# Patient Record
Sex: Male | Born: 1960
Health system: Southern US, Community
[De-identification: ages and names within clinical notes are randomized; demographics above are authoritative.]

## PROBLEM LIST (undated history)

## (undated) DIAGNOSIS — E785 Hyperlipidemia, unspecified: Secondary | ICD-10-CM

## (undated) DIAGNOSIS — K219 Gastro-esophageal reflux disease without esophagitis: Secondary | ICD-10-CM

## (undated) DIAGNOSIS — Z720 Tobacco use: Secondary | ICD-10-CM

## (undated) DIAGNOSIS — N529 Male erectile dysfunction, unspecified: Secondary | ICD-10-CM

## (undated) DIAGNOSIS — E079 Disorder of thyroid, unspecified: Secondary | ICD-10-CM

## (undated) DIAGNOSIS — E039 Hypothyroidism, unspecified: Secondary | ICD-10-CM

## (undated) DIAGNOSIS — F419 Anxiety disorder, unspecified: Secondary | ICD-10-CM

## (undated) HISTORY — DX: Tobacco use: Z72.0

## (undated) HISTORY — DX: Hyperlipidemia, unspecified: E78.5

## (undated) HISTORY — PX: UMBILICAL HERNIA REPAIR: SUR1181

## (undated) HISTORY — DX: Male erectile dysfunction, unspecified: N52.9

## (undated) HISTORY — DX: Disorder of thyroid, unspecified: E07.9

## (undated) HISTORY — DX: Gastro-esophageal reflux disease without esophagitis: K21.9

## (undated) HISTORY — PX: HERNIA REPAIR: SHX51

## (undated) HISTORY — PX: POLYPECTOMY: SHX149

## (undated) HISTORY — PX: COLONOSCOPY: SHX174

## (undated) HISTORY — PX: SPINE SURGERY: SHX786

## (undated) HISTORY — DX: Anxiety disorder, unspecified: F41.9

## (undated) HISTORY — PX: WISDOM TOOTH EXTRACTION: SHX21

---

## 1996-11-15 HISTORY — PX: APPENDECTOMY: SHX54

## 1998-11-15 HISTORY — PX: VASECTOMY: SHX75

## 2001-11-15 HISTORY — PX: HERNIA REPAIR: SHX51

## 2002-11-27 ENCOUNTER — Ambulatory Visit (HOSPITAL_COMMUNITY): Admission: RE | Admit: 2002-11-27 | Discharge: 2002-11-27 | Payer: Self-pay | Admitting: *Deleted

## 2005-09-03 ENCOUNTER — Ambulatory Visit: Payer: Self-pay | Admitting: Family Medicine

## 2005-09-13 ENCOUNTER — Ambulatory Visit: Payer: Self-pay | Admitting: Family Medicine

## 2006-09-19 ENCOUNTER — Ambulatory Visit: Payer: Self-pay | Admitting: Family Medicine

## 2006-09-19 LAB — CONVERTED CEMR LAB
ALT: 22 units/L (ref 0–40)
AST: 22 units/L (ref 0–37)
Albumin: 4.2 g/dL (ref 3.5–5.2)
Alkaline Phosphatase: 40 units/L (ref 39–117)
BUN: 16 mg/dL (ref 6–23)
Basophils Absolute: 0 10*3/uL (ref 0.0–0.1)
Basophils Relative: 0 % (ref 0.0–1.0)
CO2: 30 meq/L (ref 19–32)
Calcium: 9.4 mg/dL (ref 8.4–10.5)
Chloride: 105 meq/L (ref 96–112)
Chol/HDL Ratio, serum: 5.3
Cholesterol: 182 mg/dL (ref 0–200)
Creatinine, Ser: 1.1 mg/dL (ref 0.4–1.5)
Eosinophil percent: 2.4 % (ref 0.0–5.0)
GFR calc non Af Amer: 77 mL/min
Glomerular Filtration Rate, Af Am: 94 mL/min/{1.73_m2}
Glucose, Bld: 92 mg/dL (ref 70–99)
HCT: 43.6 % (ref 39.0–52.0)
HDL: 34.4 mg/dL — ABNORMAL LOW (ref >39.0)
Hemoglobin: 14.7 g/dL (ref 13.0–17.0)
LDL Cholesterol: 129 mg/dL — ABNORMAL HIGH (ref 0–99)
Lymphocytes Relative: 48 % — ABNORMAL HIGH (ref 12.0–46.0)
MCHC: 33.7 g/dL (ref 30.0–36.0)
MCV: 91.9 fL (ref 78.0–100.0)
Monocytes Absolute: 0.4 10*3/uL (ref 0.2–0.7)
Monocytes Relative: 8.5 % (ref 3.0–11.0)
Neutro Abs: 1.9 10*3/uL (ref 1.4–7.7)
Neutrophils Relative %: 41.1 % — ABNORMAL LOW (ref 43.0–77.0)
Platelets: 176 10*3/uL (ref 150–400)
Potassium: 4.5 meq/L (ref 3.5–5.1)
RBC: 4.75 M/uL (ref 4.22–5.81)
RDW: 12.5 % (ref 11.5–14.6)
Sodium: 140 meq/L (ref 135–145)
TSH: 5.87 microintl units/mL — ABNORMAL HIGH (ref 0.35–5.50)
Total Bilirubin: 0.8 mg/dL (ref 0.3–1.2)
Total Protein: 6.8 g/dL (ref 6.0–8.3)
Triglyceride fasting, serum: 94 mg/dL (ref 0–149)
VLDL: 19 mg/dL (ref 0–40)
WBC: 4.7 10*3/uL (ref 4.5–10.5)

## 2006-09-26 ENCOUNTER — Ambulatory Visit: Payer: Self-pay | Admitting: Family Medicine

## 2007-09-20 ENCOUNTER — Ambulatory Visit: Payer: Self-pay | Admitting: Family Medicine

## 2007-09-20 LAB — CONVERTED CEMR LAB
ALT: 37 units/L (ref 0–53)
AST: 37 units/L (ref 0–37)
Albumin: 4.4 g/dL (ref 3.5–5.2)
Alkaline Phosphatase: 49 units/L (ref 39–117)
BUN: 15 mg/dL (ref 6–23)
Basophils Absolute: 0 10*3/uL (ref 0.0–0.1)
Basophils Relative: 0.9 % (ref 0.0–1.0)
Bilirubin Urine: NEGATIVE
Bilirubin, Direct: 0.2 mg/dL (ref 0.0–0.3)
Blood in Urine, dipstick: NEGATIVE
CO2: 32 meq/L (ref 19–32)
Calcium: 10.2 mg/dL (ref 8.4–10.5)
Chloride: 106 meq/L (ref 96–112)
Cholesterol: 163 mg/dL (ref 0–200)
Creatinine, Ser: 1.2 mg/dL (ref 0.4–1.5)
Eosinophils Absolute: 0.1 10*3/uL (ref 0.0–0.6)
Eosinophils Relative: 2.4 % (ref 0.0–5.0)
GFR calc Af Amer: 84 mL/min
GFR calc non Af Amer: 70 mL/min
Glucose, Bld: 101 mg/dL — ABNORMAL HIGH (ref 70–99)
Glucose, Urine, Semiquant: NEGATIVE
HCT: 43.5 % (ref 39.0–52.0)
HDL: 29.8 mg/dL — ABNORMAL LOW (ref >39.0)
Hemoglobin: 15.2 g/dL (ref 13.0–17.0)
Ketones, urine, test strip: NEGATIVE
LDL Cholesterol: 112 mg/dL — ABNORMAL HIGH (ref 0–99)
Lymphocytes Relative: 46.8 % — ABNORMAL HIGH (ref 12.0–46.0)
MCHC: 35 g/dL (ref 30.0–36.0)
MCV: 90.2 fL (ref 78.0–100.0)
Monocytes Absolute: 0.4 10*3/uL (ref 0.2–0.7)
Monocytes Relative: 9 % (ref 3.0–11.0)
Neutro Abs: 1.7 10*3/uL (ref 1.4–7.7)
Neutrophils Relative %: 40.9 % — ABNORMAL LOW (ref 43.0–77.0)
Nitrite: NEGATIVE
Platelets: 185 10*3/uL (ref 150–400)
Potassium: 5.4 meq/L — ABNORMAL HIGH (ref 3.5–5.1)
Protein, U semiquant: NEGATIVE
RBC: 4.82 M/uL (ref 4.22–5.81)
RDW: 12.3 % (ref 11.5–14.6)
Sodium: 148 meq/L — ABNORMAL HIGH (ref 135–145)
Specific Gravity, Urine: 1.02
TSH: 7.18 microintl units/mL — ABNORMAL HIGH (ref 0.35–5.50)
Total Bilirubin: 1.1 mg/dL (ref 0.3–1.2)
Total CHOL/HDL Ratio: 5.5
Total Protein: 6.9 g/dL (ref 6.0–8.3)
Triglycerides: 108 mg/dL (ref 0–149)
Urobilinogen, UA: NEGATIVE
VLDL: 22 mg/dL (ref 0–40)
WBC Urine, dipstick: NEGATIVE
WBC: 4.2 10*3/uL — ABNORMAL LOW (ref 4.5–10.5)
pH: 7

## 2007-10-02 ENCOUNTER — Ambulatory Visit: Payer: Self-pay | Admitting: Family Medicine

## 2007-10-02 DIAGNOSIS — E785 Hyperlipidemia, unspecified: Secondary | ICD-10-CM | POA: Insufficient documentation

## 2007-10-02 DIAGNOSIS — F411 Generalized anxiety disorder: Secondary | ICD-10-CM | POA: Insufficient documentation

## 2007-10-02 DIAGNOSIS — E039 Hypothyroidism, unspecified: Secondary | ICD-10-CM | POA: Insufficient documentation

## 2007-10-16 ENCOUNTER — Telehealth: Payer: Self-pay | Admitting: Family Medicine

## 2007-10-23 ENCOUNTER — Ambulatory Visit: Payer: Self-pay | Admitting: Family Medicine

## 2007-10-23 LAB — CONVERTED CEMR LAB: TSH: 6.57 microintl units/mL — ABNORMAL HIGH (ref 0.35–5.50)

## 2007-10-25 ENCOUNTER — Telehealth: Payer: Self-pay | Admitting: Family Medicine

## 2008-01-09 ENCOUNTER — Ambulatory Visit: Payer: Self-pay | Admitting: Family Medicine

## 2008-01-09 LAB — CONVERTED CEMR LAB: TSH: 3.18 microintl units/mL (ref 0.35–5.50)

## 2008-03-26 ENCOUNTER — Telehealth (INDEPENDENT_AMBULATORY_CARE_PROVIDER_SITE_OTHER): Payer: Self-pay | Admitting: *Deleted

## 2008-04-03 ENCOUNTER — Telehealth: Payer: Self-pay | Admitting: Family Medicine

## 2008-09-18 ENCOUNTER — Ambulatory Visit: Payer: Self-pay | Admitting: Family Medicine

## 2008-09-18 LAB — CONVERTED CEMR LAB
ALT: 21 units/L (ref 0–53)
AST: 25 units/L (ref 0–37)
Albumin: 4.3 g/dL (ref 3.5–5.2)
Alkaline Phosphatase: 52 units/L (ref 39–117)
BUN: 18 mg/dL (ref 6–23)
Basophils Absolute: 0 10*3/uL (ref 0.0–0.1)
Basophils Relative: 0 % (ref 0.0–3.0)
Bilirubin Urine: NEGATIVE
Bilirubin, Direct: 0.1 mg/dL (ref 0.0–0.3)
Blood in Urine, dipstick: NEGATIVE
CO2: 28 meq/L (ref 19–32)
Calcium: 9.5 mg/dL (ref 8.4–10.5)
Chloride: 107 meq/L (ref 96–112)
Cholesterol: 139 mg/dL (ref 0–200)
Creatinine, Ser: 1 mg/dL (ref 0.4–1.5)
Eosinophils Absolute: 0.1 10*3/uL (ref 0.0–0.7)
Eosinophils Relative: 1.9 % (ref 0.0–5.0)
GFR calc Af Amer: 103 mL/min
GFR calc non Af Amer: 86 mL/min
Glucose, Bld: 89 mg/dL (ref 70–99)
Glucose, Urine, Semiquant: NEGATIVE
HCT: 43.3 % (ref 39.0–52.0)
HDL: 33.4 mg/dL — ABNORMAL LOW (ref >39.0)
Hemoglobin: 15 g/dL (ref 13.0–17.0)
Ketones, urine, test strip: NEGATIVE
LDL Cholesterol: 89 mg/dL (ref 0–99)
Lymphocytes Relative: 51.6 % — ABNORMAL HIGH (ref 12.0–46.0)
MCHC: 34.5 g/dL (ref 30.0–36.0)
MCV: 90.4 fL (ref 78.0–100.0)
Monocytes Absolute: 0.4 10*3/uL (ref 0.1–1.0)
Monocytes Relative: 9.2 % (ref 3.0–12.0)
Neutro Abs: 1.7 10*3/uL (ref 1.4–7.7)
Neutrophils Relative %: 37.3 % — ABNORMAL LOW (ref 43.0–77.0)
Nitrite: NEGATIVE
Platelets: 178 10*3/uL (ref 150–400)
Potassium: 4.8 meq/L (ref 3.5–5.1)
Protein, U semiquant: NEGATIVE
RBC: 4.8 M/uL (ref 4.22–5.81)
RDW: 12.8 % (ref 11.5–14.6)
Sodium: 143 meq/L (ref 135–145)
Specific Gravity, Urine: 1.02
TSH: 6.4 microintl units/mL — ABNORMAL HIGH (ref 0.35–5.50)
Total Bilirubin: 0.9 mg/dL (ref 0.3–1.2)
Total CHOL/HDL Ratio: 4.2
Total Protein: 6.9 g/dL (ref 6.0–8.3)
Triglycerides: 85 mg/dL (ref 0–149)
Urobilinogen, UA: 0.2
VLDL: 17 mg/dL (ref 0–40)
WBC Urine, dipstick: NEGATIVE
WBC: 4.5 10*3/uL (ref 4.5–10.5)
pH: 7

## 2008-10-03 ENCOUNTER — Ambulatory Visit: Payer: Self-pay | Admitting: Family Medicine

## 2008-10-03 DIAGNOSIS — F528 Other sexual dysfunction not due to a substance or known physiological condition: Secondary | ICD-10-CM | POA: Insufficient documentation

## 2008-10-03 DIAGNOSIS — F17200 Nicotine dependence, unspecified, uncomplicated: Secondary | ICD-10-CM | POA: Insufficient documentation

## 2008-10-22 ENCOUNTER — Telehealth: Payer: Self-pay | Admitting: *Deleted

## 2009-04-29 ENCOUNTER — Ambulatory Visit: Payer: Self-pay | Admitting: Family Medicine

## 2009-04-29 DIAGNOSIS — L909 Atrophic disorder of skin, unspecified: Secondary | ICD-10-CM | POA: Insufficient documentation

## 2009-04-29 DIAGNOSIS — L919 Hypertrophic disorder of the skin, unspecified: Secondary | ICD-10-CM

## 2009-09-24 ENCOUNTER — Ambulatory Visit: Payer: Self-pay | Admitting: Family Medicine

## 2009-09-24 LAB — CONVERTED CEMR LAB
ALT: 35 units/L (ref 0–53)
AST: 33 units/L (ref 0–37)
Albumin: 4.6 g/dL (ref 3.5–5.2)
Alkaline Phosphatase: 49 units/L (ref 39–117)
BUN: 10 mg/dL (ref 6–23)
Basophils Absolute: 0 10*3/uL (ref 0.0–0.1)
Basophils Relative: 0.8 % (ref 0.0–3.0)
Bilirubin Urine: NEGATIVE
Bilirubin, Direct: 0.1 mg/dL (ref 0.0–0.3)
Blood in Urine, dipstick: NEGATIVE
CO2: 29 meq/L (ref 19–32)
Calcium: 9.6 mg/dL (ref 8.4–10.5)
Chloride: 106 meq/L (ref 96–112)
Cholesterol: 164 mg/dL (ref 0–200)
Creatinine, Ser: 1.1 mg/dL (ref 0.4–1.5)
Eosinophils Absolute: 0.1 10*3/uL (ref 0.0–0.7)
Eosinophils Relative: 2.2 % (ref 0.0–5.0)
GFR calc non Af Amer: 76.18 mL/min (ref >60)
Glucose, Bld: 106 mg/dL — ABNORMAL HIGH (ref 70–99)
Glucose, Urine, Semiquant: NEGATIVE
HCT: 44.1 % (ref 39.0–52.0)
HDL: 34 mg/dL — ABNORMAL LOW (ref >39.00)
Hemoglobin: 15.1 g/dL (ref 13.0–17.0)
Ketones, urine, test strip: NEGATIVE
LDL Cholesterol: 106 mg/dL — ABNORMAL HIGH (ref 0–99)
Lymphocytes Relative: 47.1 % — ABNORMAL HIGH (ref 12.0–46.0)
Lymphs Abs: 2.2 10*3/uL (ref 0.7–4.0)
MCHC: 34.3 g/dL (ref 30.0–36.0)
MCV: 92.5 fL (ref 78.0–100.0)
Monocytes Absolute: 0.4 10*3/uL (ref 0.1–1.0)
Monocytes Relative: 9.2 % (ref 3.0–12.0)
Neutro Abs: 1.8 10*3/uL (ref 1.4–7.7)
Neutrophils Relative %: 40.7 % — ABNORMAL LOW (ref 43.0–77.0)
Nitrite: NEGATIVE
Platelets: 161 10*3/uL (ref 150.0–400.0)
Potassium: 4.9 meq/L (ref 3.5–5.1)
Protein, U semiquant: NEGATIVE
RBC: 4.76 M/uL (ref 4.22–5.81)
RDW: 12.8 % (ref 11.5–14.6)
Sodium: 144 meq/L (ref 135–145)
Specific Gravity, Urine: 1.01
TSH: 3.89 microintl units/mL (ref 0.35–5.50)
Total Bilirubin: 1.2 mg/dL (ref 0.3–1.2)
Total CHOL/HDL Ratio: 5
Total Protein: 7.5 g/dL (ref 6.0–8.3)
Triglycerides: 118 mg/dL (ref 0.0–149.0)
Urobilinogen, UA: 0.2
VLDL: 23.6 mg/dL (ref 0.0–40.0)
WBC Urine, dipstick: NEGATIVE
WBC: 4.5 10*3/uL (ref 4.5–10.5)
pH: 7

## 2009-10-06 ENCOUNTER — Ambulatory Visit: Payer: Self-pay | Admitting: Family Medicine

## 2010-07-07 ENCOUNTER — Ambulatory Visit: Payer: Self-pay | Admitting: Family Medicine

## 2010-07-07 DIAGNOSIS — L255 Unspecified contact dermatitis due to plants, except food: Secondary | ICD-10-CM | POA: Insufficient documentation

## 2010-10-15 ENCOUNTER — Ambulatory Visit: Payer: Self-pay | Admitting: Family Medicine

## 2010-10-15 LAB — CONVERTED CEMR LAB
ALT: 31 units/L (ref 0–53)
Albumin: 4.4 g/dL (ref 3.5–5.2)
BUN: 15 mg/dL (ref 6–23)
Basophils Absolute: 0 10*3/uL (ref 0.0–0.1)
Bilirubin Urine: NEGATIVE
Blood in Urine, dipstick: NEGATIVE
CO2: 29 meq/L (ref 19–32)
Eosinophils Relative: 1.9 % (ref 0.0–5.0)
Glucose, Urine, Semiquant: NEGATIVE
HCT: 42.5 % (ref 39.0–52.0)
HDL: 35.1 mg/dL — ABNORMAL LOW (ref >39.00)
Ketones, urine, test strip: NEGATIVE
LDL Cholesterol: 114 mg/dL — ABNORMAL HIGH (ref 0–99)
Lymphs Abs: 2.3 10*3/uL (ref 0.7–4.0)
MCV: 90.1 fL (ref 78.0–100.0)
Monocytes Absolute: 0.4 10*3/uL (ref 0.1–1.0)
Nitrite: NEGATIVE
Platelets: 186 10*3/uL (ref 150.0–400.0)
Protein, U semiquant: NEGATIVE
RDW: 13.6 % (ref 11.5–14.6)
Sodium: 136 meq/L (ref 135–145)
Specific Gravity, Urine: 1.02
TSH: 7.17 microintl units/mL — ABNORMAL HIGH (ref 0.35–5.50)
Total Bilirubin: 0.9 mg/dL (ref 0.3–1.2)
Total CHOL/HDL Ratio: 5
Triglycerides: 129 mg/dL (ref 0.0–149.0)
Urobilinogen, UA: 0.2
VLDL: 25.8 mg/dL (ref 0.0–40.0)
WBC Urine, dipstick: NEGATIVE
pH: 7

## 2010-10-22 ENCOUNTER — Ambulatory Visit: Payer: Self-pay | Admitting: Family Medicine

## 2010-10-22 ENCOUNTER — Encounter: Payer: Self-pay | Admitting: Family Medicine

## 2010-10-22 DIAGNOSIS — K625 Hemorrhage of anus and rectum: Secondary | ICD-10-CM | POA: Insufficient documentation

## 2010-11-27 ENCOUNTER — Encounter (INDEPENDENT_AMBULATORY_CARE_PROVIDER_SITE_OTHER): Payer: Self-pay | Admitting: *Deleted

## 2010-12-15 NOTE — Assessment & Plan Note (Signed)
Summary: cpx//ccm   Vital Signs:  Patient profile:   50 year old male Height:      76.25 inches Weight:      272 pounds Temp:     97.6 degrees F oral Pulse rate:   52 / minute BP sitting:   100 / 68  (left arm) Cuff size:   regular  Vitals Entered By: Kathlene November LPN (October 22, 2010 10:34 AM) CC: CPE   CC:  CPE.  History of Present Illness: [name] is a 50 year old male, married, nonsmoker, who comes in today for general physical examination because of history of hyperlipidemia, hypothyroidism, eczema, erectile dysfunction, and a new problem of bright red rectal bleeding.  He takes Zocor 40 mg nightly for hyperlipidemia.  Lipids are goal.  He also takes 5 or 50 mg p.r.n. for ED.  He also takes Synthroid 75 micrograms daily because of a history of hypothyroidism.  He also uses triamcinolone ointment p.r.n. for eczema.  Review of systems negative except he is having some bright red rectal bleeding.  He states this is been going off and on for a couple years.  No pain however, his mother has a history of colon polyps.  He gets routine eye care, dental care, tetanus, 2005, declines a flu shot  Current Medications (verified): 1)  Zocor 40 Mg  Tabs (Simvastatin) .Marland Kitchen.. 1 Tab @ Bedtime 2)  Viagra 50 Mg  Tabs (Sildenafil Citrate) .... Uad 3)  Synthroid 75 Mcg Tabs (Levothyroxine Sodium) .... Take 1 Tablet By Mouth Every Morning 4)  Triamcinolone Acetonide 0.1 % Oint (Triamcinolone Acetonide) .... Apply Two Times A Day  Allergies (verified): No Known Drug Allergies  Comments:  Nurse/Medical Assistant: The patient's medications and allergies were reviewed with the patient and were updated in the Medication and Allergy Lists. Kathlene November LPN (October 22, 2010 10:35 AM)  Past History:  Past medical, surgical, family and social histories (including risk factors) reviewed, and no changes noted (except as noted below).  Past Medical History: Reviewed history from 10/03/2008 and  no changes required. Anxiety secondary to divorce Hyperlipidemia ED Hypothyroidism tobacco abuse  Past Surgical History: Reviewed history from 10/03/2008 and no changes required. inguinal hernia repair with redo at age 65  Family History: Reviewed history from 10/02/2007 and no changes required. Family History High cholesterol Family History Hypertension  Social History: Reviewed history from 10/06/2009 and no changes required. Occupation: Divorced Alcohol use-no Drug use-no Regular exercise-yes Former Smoker   05/2009 quit  Review of Systems      See HPI  Physical Exam  General:  Well-developed,well-nourished,in no acute distress; alert,appropriate and cooperative throughout examination Head:  Normocephalic and atraumatic without obvious abnormalities. No apparent alopecia or balding. Eyes:  No corneal or conjunctival inflammation noted. EOMI. Perrla. Funduscopic exam benign, without hemorrhages, exudates or papilledema. Vision grossly normal. Ears:  External ear exam shows no significant lesions or deformities.  Otoscopic examination reveals clear canals, tympanic membranes are intact bilaterally without bulging, retraction, inflammation or discharge. Hearing is grossly normal bilaterally. Nose:  External nasal examination shows no deformity or inflammation. Nasal mucosa are pink and moist without lesions or exudates. Mouth:  Oral mucosa and oropharynx without lesions or exudates.  Teeth in good repair. Neck:  No deformities, masses, or tenderness noted. Chest Wall:  No deformities, masses, tenderness or gynecomastia noted. Breasts:  No masses or gynecomastia noted Lungs:  Normal respiratory effort, chest expands symmetrically. Lungs are clear to auscultation, no crackles or wheezes. Heart:  Normal rate and regular rhythm. S1 and S2 normal without gallop, murmur, click, rub or other extra sounds. Abdomen:  Bowel sounds positive,abdomen soft and non-tender without masses,  organomegaly or hernias noted. Rectal:  no external lesions.  DRE normal.  No palpable masses however, brown stool, guaiac-positive Genitalia:  Testes bilaterally descended without nodularity, tenderness or masses. No scrotal masses or lesions. No penis lesions or urethral discharge. Prostate:  Prostate gland firm and smooth, no enlargement, nodularity, tenderness, mass, asymmetry or induration. Msk:  No deformity or scoliosis noted of thoracic or lumbar spine.   Pulses:  R and L carotid,radial,femoral,dorsalis pedis and posterior tibial pulses are full and equal bilaterally Extremities:  No clubbing, cyanosis, edema, or deformity noted with normal full range of motion of all joints.   Neurologic:  No cranial nerve deficits noted. Station and gait are normal. Plantar reflexes are down-going bilaterally. DTRs are symmetrical throughout. Sensory, motor and coordinative functions appear intact. Skin:  Intact without suspicious lesions or rashes Cervical Nodes:  No lymphadenopathy noted Axillary Nodes:  No palpable lymphadenopathy Inguinal Nodes:  No significant adenopathy Psych:  Cognition and judgment appear intact. Alert and cooperative with normal attention span and concentration. No apparent delusions, illusions, hallucinations   Impression & Recommendations:  Problem # 1:  CONTACT DERMATITIS&OTHER ECZEMA DUE TO PLANTS (ICD-692.6) Assessment Improved  His updated medication list for this problem includes:    Triamcinolone Acetonide 0.1 % Oint (Triamcinolone acetonide) .Marland Kitchen... Apply two times a day  Orders: Prescription Created Electronically [phone])  Problem # 2:  ERECTILE DYSFUNCTION (ICD-302.72) Assessment: Improved  His updated medication list for this problem includes:    Viagra 50 Mg Tabs (Sildenafil citrate) ..... Uad  Orders: Prescription Created Electronically [phone])  Problem # 3:  HYPOTHYROIDISM (ICD-244.9) Assessment: Improved  The following medications were removed  from the medication list:    Synthroid 75 Mcg Tabs (Levothyroxine sodium) .Marland Kitchen... Take 1 tablet by mouth every morning His updated medication list for this problem includes:    Synthroid 100 Mcg Tabs (Levothyroxine sodium) .Marland Kitchen... Take 1 tablet by mouth every morning  Orders: Prescription Created Electronically [phone])  Problem # 4:  PHYSICAL EXAMINATION (ICD-V70.0) Assessment: Unchanged  Orders: Prescription Created Electronically [phone])  Problem # 5:  HEMORRHAGE OF RECTUM AND ANUS (ICD-569.3) Assessment: New  Orders: Gastroenterology Referral (GI) Prescription Created Electronically [id number])  Complete Medication List: 1)  Zocor 40 Mg Tabs (Simvastatin) .Marland Kitchen.. 1 tab @ bedtime 2)  Viagra 50 Mg Tabs (Sildenafil citrate) .... Uad 3)  Triamcinolone Acetonide 0.1 % Oint (Triamcinolone acetonide) .... Apply two times a day 4)  Synthroid 100 Mcg Tabs (Levothyroxine sodium) .... Take 1 tablet by mouth every morning  Patient Instructions: 1)  take milk of Magnesia or turned juice on a daily basis as a stool softener, and we will do to set up in GI for a colonoscopy 2)  It is important that you exercise regularly at least 20 minutes 5 times a week. If you develop chest pain, have severe difficulty breathing, or feel very tired , stop exercising immediately and seek medical attention. 3)  Schedule a colonoscopy/sigmoidoscopy to help detect colon cancer. 4)  Stop aspirin, and all aspirin products until after the colonoscopy 5)  Please schedule a follow-up appointment in 1 year. Prescriptions: SYNTHROID 100 MCG TABS (LEVOTHYROXINE SODIUM) Take 1 tablet by mouth every morning  #100 x 3   Entered and Authorized by:   Roderick Pee MD   Signed by:   Roderick Pee  MD on 10/22/2010   Method used:   Electronically to        [facility]. [phone]* (retail)       [phone] [address]       [place], [place]  [address]       Ph: [phone]       Fax: [phone]    RxID:   [id number] TRIAMCINOLONE ACETONIDE 0.1 % OINT (TRIAMCINOLONE ACETONIDE) apply two times a day  #30 gr x 2   Entered and Authorized by:   Roderick Pee MD   Signed by:   Roderick Pee MD on 10/22/2010   Method used:   Electronically to        [facility]. [phone]* (retail)       [phone] [address]       [place], [place]  [address]       Ph: [phone]       Fax: [phone]   RxID:   [id number] SYNTHROID 75 MCG TABS (LEVOTHYROXINE SODIUM) Take 1 tablet by mouth every morning  #100 x 3   Entered and Authorized by:   Roderick Pee MD   Signed by:   Roderick Pee MD on 10/22/2010   Method used:   Electronically to        [facility]. [phone]* (retail)       [phone] [address]       [place], [place]  [address]       Ph: [phone]       Fax: [phone]   RxID:   [id number] VIAGRA 50 MG  TABS (SILDENAFIL CITRATE) UAD  #6 x 11   Entered and Authorized by:   Roderick Pee MD   Signed by:   Roderick Pee MD on 10/22/2010   Method used:   Electronically to        [facility]. [phone]* (retail)       [phone] [address]       [place], [place]  [address]       Ph: [phone]       Fax: [phone]   RxID:   [id number] ZOCOR 40 MG  TABS (SIMVASTATIN) 1 tab @ bedtime  #100 x 3   Entered and Authorized by:   Roderick Pee MD   Signed by:   Roderick Pee MD on 10/22/2010   Method used:   Electronically to        [facility]. [phone]* (retail)       [phone] [address].       [place], [place]  [address]       Ph: [phone]       Fax: [phone]   RxID:   [id number]    Orders Added: 1)  Gastroenterology Referral [GI] 2)  Prescription Created Electronically [[id number]] 3)  Est. Patient 40-64 years [phone]   Not Administered:    Influenza Vaccine not given due to: declined

## 2010-12-15 NOTE — Assessment & Plan Note (Signed)
Summary: SORE ON EAR (INFECTED?) // RS   Vital Signs:  Patient profile:   50 year old male Height:      76.25 inches Weight:      272 pounds BMI:     33.01 Temp:     98.2 degrees F oral BP sitting:   134 / 90  (left arm) Cuff size:   regular  Vitals Entered By: Kern Reap CMA Duncan Dull) (July 07, 2010 12:06 PM) CC: knot on right ear   CC:  knot on right ear.  History of Present Illness: Cory Moore is a 50 year old male who comes in today for evaluation of a contact dermatitis on his right ear.    Allergies: No Known Drug Allergies  Social History: Reviewed history from 10/06/2009 and no changes required. Occupation: Divorced Alcohol use-no Drug use-no Regular exercise-yes Former Smoker   05/2009 quit  Review of Systems      See HPI  Physical Exam  General:  Well-developed,well-nourished,in no acute distress; alert,appropriate and cooperative throughout examination Skin:  contact dermatitis, right ear   Problems:  Medical Problems Added: 1)  Dx of Contact Dermatitis&other Eczema Due To Plants  (ICD-692.6)  Impression & Recommendations:  Problem # 1:  CONTACT DERMATITIS&OTHER ECZEMA DUE TO PLANTS (ICD-692.6) Assessment New  His updated medication list for this problem includes:    Triamcinolone Acetonide 0.1 % Oint (Triamcinolone acetonide) .Marland Kitchen... Apply two times a day  Complete Medication List: 1)  Zocor 40 Mg Tabs (Simvastatin) .Marland Kitchen.. 1 tab @ bedtime 2)  Viagra 50 Mg Tabs (Sildenafil citrate) .... Uad 3)  Synthroid 75 Mcg Tabs (Levothyroxine sodium) .... Take 1 tablet by mouth every morning 4)  Triamcinolone Acetonide 0.1 % Oint (Triamcinolone acetonide) .... Apply two times a day  Patient Instructions: 1)  apply small amounts of Lidex twice daily until the rash resolves Prescriptions: TRIAMCINOLONE ACETONIDE 0.1 % OINT (TRIAMCINOLONE ACETONIDE) apply two times a day  #30 gr x 2   Entered and Authorized by:   Roderick Pee MD   Signed by:   Roderick Pee  MD on 07/07/2010   Method used:   Electronically to        [facility]. [phone]* (retail)       [phone] [address].       [place], [place]  [address]       Ph: [phone]       Fax: [phone]   RxID:   [id number] TRIAMCINOLONE ACETONIDE 0.1 % OINT (TRIAMCINOLONE ACETONIDE) apply two times a day  #30 gr x 2   Entered and Authorized by:   Roderick Pee MD   Signed by:   Roderick Pee MD on 07/07/2010   Method used:   Electronically to        [facility]. [phone]* (retail)       [phone] [address]       [place], [place]  [address]       Ph: [phone]       Fax: [phone]   RxID:   [id number]

## 2010-12-17 NOTE — Letter (Signed)
Summary: New Patient letter  [facility]  [address]   [place], [place] [address]   Phone: [phone]  Fax: [phone]       11/27/2010 MRN: [id number]  Cory Moore [address] [place], [place]  [address]  Dear Cory Moore,  Welcome to the Gastroenterology Division at [facility].    You are scheduled to see Dr.  Jarold Motto on 12/31/2010 at 9:00am on the 3rd floor at [facility], [address]  We ask that you try to arrive at our office 15 minutes prior to your appointment time to allow for check-in.  We would like you to complete the enclosed self-administered evaluation form prior to your visit and bring it with you on the day of your appointment.  We will review it with you.  Also, please bring a complete list of all your medications or, if you prefer, bring the medication bottles and we will list them.  Please bring your insurance card so that we may make a copy of it.  If your insurance requires a referral to see a specialist, please bring your referral form from your primary care physician.  Co-payments are due at the time of your visit and may be paid by cash, check or credit card.     Your office visit will consist of a consult with your physician (includes a physical exam), any laboratory testing he/she may order, scheduling of any necessary diagnostic testing (e.g. x-ray, ultrasound, CT-scan), and scheduling of a procedure (e.g. Endoscopy, Colonoscopy) if required.  Please allow enough time on your schedule to allow for any/all of these possibilities.    If you cannot keep your appointment, please call [phone] to cancel or reschedule prior to your appointment date.  This allows [place] the opportunity to schedule an appointment for another patient in need of care.  If you do not cancel or reschedule by 5 p.m. the business day prior to your appointment date, you will be charged a $50.00 late cancellation/no-show fee.    Thank you for  choosing [facility] for your medical needs.  We appreciate the opportunity to care for you.  Please visit [place] at our website  to learn more about our practice.                     Sincerely,                                                             The Gastroenterology Division

## 2010-12-31 ENCOUNTER — Ambulatory Visit: Payer: Self-pay | Admitting: Gastroenterology

## 2011-02-16 ENCOUNTER — Other Ambulatory Visit: Payer: Self-pay | Admitting: *Deleted

## 2011-02-16 MED ORDER — SILDENAFIL CITRATE 50 MG PO TABS: 50.0000 mg | ORAL_TABLET | ORAL | Status: DC | PRN

## 2011-02-16 MED ORDER — SIMVASTATIN 40 MG PO TABS: 40.0000 mg | ORAL_TABLET | Freq: Every evening | ORAL | Status: DC

## 2011-02-16 MED ORDER — LEVOTHYROXINE SODIUM 100 MCG PO TABS: 100.0000 ug | ORAL_TABLET | Freq: Every day | ORAL | Status: DC

## 2011-02-16 NOTE — Telephone Encounter (Signed)
patient  Is calling to have his Rx transferred to [facility]

## 2011-04-02 NOTE — Op Note (Signed)
NAME:  Cory Moore, Cory Moore                        ACCOUNT NO.:  [id number]   MEDICAL RECORD NO.:  [id number]                   PATIENT TYPE:  AMB   LOCATION:  DAY                                  FACILITY:  [facility]   PHYSICIAN:  Vikki Ports, M.D.         DATE OF BIRTH:  January 06, 1961   DATE OF PROCEDURE:  11/27/2002  DATE OF DISCHARGE:                                 OPERATIVE REPORT   PREOPERATIVE DIAGNOSES:  Bilateral inguinal hernias.   POSTOPERATIVE DIAGNOSES:  Bilateral inguinal hernias.   PROCEDURE:  Laparoscopic bilateral inguinal hernia repair with mesh.   SURGEON:  Vikki Ports, M.D.   ASSISTANT:  Sandria Bales. Ezzard Standing, M.D.   ANESTHESIA:  General.   DESCRIPTION OF PROCEDURE:  The patient was taken to the operating room,  placed in a supine position and after adequate general anesthesia was  induced using endotracheal tube, the abdomen was prepped and draped in the  normal sterile fashion. A Foley catheter was placed. A transverse  infraumbilical incision was made and the right anterior rectus sheath was  identified. An incision was made in this anterior sheath and rectus muscle  was retracted laterally. The dissecting balloon was then placed in the  preperitoneal space and hand insufflated under direct visualization. This  gave good bilateral deployment and bilateral epigastrics as well as pubic  tubercle was visualized. The dissecting balloon was removed and operating  balloon was placed. The right sided direct hernia defect was identified,  measured about 1 1/2 cm in diameter. Cooper's ligament was completely  identified, spermatic cord was mobilized and the peritoneal edge was  visualized and inspected. There was no evidence of indirect hernia defect.  The left side was also explored in a similar fashion after three 5 mm ports  were placed in the abdomen. One was in the midline and two in the bilateral  lower quadrants. The left sided defect was much smaller  and again the  peritoneal edge could be identified and reflected. No evidence of indirect  hernia was noted. With good mobilization bilaterally to the anterior  superior iliac spine and good identification of bilateral Cooper's  ligaments, a 4 1/2 x 6 inch Parietex polyester mesh was placed through the  operating port. It was tacked to the pubic tubercle to Cooper's ligament and  to the anterior abdominal wall both medial and lateral to the left  epigastric vessels. Identical procedure was performed on the right side  making good assurance that the hernia defects were covered. The mesh met and  overlapped slightly in the middle in the midline and both lateral edges were  held down as pneumopreperitoneum was released. The anterior fascial defect  was closed with the figure-of-eight #0 Vicryl suture, skin incisions were  closed with subcuticular 4-0 Monocryl, Steri-Strips and sterile dressings  were applied. The patient tolerated the procedure well and went to PACU in  good condition.  Vikki Ports, M.D.    KRH/MEDQ  D:  11/27/2002  T:  11/27/2002  Job:  [id number]

## 2011-10-18 ENCOUNTER — Other Ambulatory Visit (INDEPENDENT_AMBULATORY_CARE_PROVIDER_SITE_OTHER): Payer: 59

## 2011-10-18 DIAGNOSIS — Z0000 Encounter for general adult medical examination without abnormal findings: Secondary | ICD-10-CM

## 2011-10-18 LAB — CBC WITH DIFFERENTIAL/PLATELET
Basophils Relative: 0.5 % (ref 0.0–3.0)
Eosinophils Relative: 2.8 % (ref 0.0–5.0)
HCT: 42.7 % (ref 39.0–52.0)
Hemoglobin: 14.7 g/dL (ref 13.0–17.0)
Lymphs Abs: 2.6 10*3/uL (ref 0.7–4.0)
Monocytes Relative: 8.7 % (ref 3.0–12.0)
Neutro Abs: 1.8 10*3/uL (ref 1.4–7.7)
Platelets: 185 10*3/uL (ref 150.0–400.0)
RBC: 4.66 Mil/uL (ref 4.22–5.81)
WBC: 5 10*3/uL (ref 4.5–10.5)

## 2011-10-18 LAB — POCT URINALYSIS DIPSTICK
Glucose, UA: NEGATIVE
Nitrite, UA: NEGATIVE
Spec Grav, UA: 1.015
Urobilinogen, UA: 0.2

## 2011-10-18 LAB — HEPATIC FUNCTION PANEL
ALT: 27 U/L (ref 0–53)
AST: 25 U/L (ref 0–37)
Total Bilirubin: 0.7 mg/dL (ref 0.3–1.2)

## 2011-10-18 LAB — BASIC METABOLIC PANEL
CO2: 25 mEq/L (ref 19–32)
Calcium: 9 mg/dL (ref 8.4–10.5)
Sodium: 139 mEq/L (ref 135–145)

## 2011-10-18 LAB — LIPID PANEL
Cholesterol: 158 mg/dL (ref 0–200)
LDL Cholesterol: 93 mg/dL (ref 0–99)

## 2011-10-18 LAB — TSH: TSH: 6.36 u[IU]/mL — ABNORMAL HIGH (ref 0.35–5.50)

## 2011-10-25 ENCOUNTER — Ambulatory Visit (INDEPENDENT_AMBULATORY_CARE_PROVIDER_SITE_OTHER): Payer: 59 | Admitting: Family Medicine

## 2011-10-25 ENCOUNTER — Encounter: Payer: Self-pay | Admitting: Family Medicine

## 2011-10-25 DIAGNOSIS — Z83719 Family history of colon polyps, unspecified: Secondary | ICD-10-CM

## 2011-10-25 DIAGNOSIS — N529 Male erectile dysfunction, unspecified: Secondary | ICD-10-CM

## 2011-10-25 DIAGNOSIS — E785 Hyperlipidemia, unspecified: Secondary | ICD-10-CM

## 2011-10-25 DIAGNOSIS — E039 Hypothyroidism, unspecified: Secondary | ICD-10-CM

## 2011-10-25 DIAGNOSIS — F528 Other sexual dysfunction not due to a substance or known physiological condition: Secondary | ICD-10-CM

## 2011-10-25 DIAGNOSIS — Z8371 Family history of colonic polyps: Secondary | ICD-10-CM

## 2011-10-25 DIAGNOSIS — Z23 Encounter for immunization: Secondary | ICD-10-CM

## 2011-10-25 MED ORDER — SILDENAFIL CITRATE 50 MG PO TABS: 50.0000 mg | ORAL_TABLET | ORAL | Status: DC | PRN

## 2011-10-25 MED ORDER — LEVOTHYROXINE SODIUM 100 MCG PO TABS: 100.0000 ug | ORAL_TABLET | Freq: Every day | ORAL | Status: DC

## 2011-10-25 MED ORDER — SIMVASTATIN 40 MG PO TABS: 40.0000 mg | ORAL_TABLET | Freq: Every evening | ORAL | Status: DC

## 2011-10-25 NOTE — Patient Instructions (Signed)
Continue your current medications.  We will give to set up for a colonoscopy because of the family history of colon polyps.  Return in one year, sooner if any problem

## 2011-10-25 NOTE — Progress Notes (Signed)
  Subjective:    Patient ID: Cory Moore, male    DOB: Jun 20, 1961, 50 y.o.   MRN: [id number]  HPI Cory Moore is a 50 year old male ex smoker x 2 years, who comes in today for general physical examination because of a history of hypothyroidism, hyperlipidemia, erectile dysfunction.  He takes 100 mcg of thyroid daily.  TSH level is 6.36.  He takes Viagra 50 mg p.r.n. For ED.  He takes Zocor 40 mg nightly for hyperlipidemia.  Lipids are at goal.  Review of systems negative family history.  He now recalls that his mother had colon polyps.  I recommend that he have a screening colonoscopy.  This year because of the family history of colon polyps   Review of Systems  Constitutional: Negative.   HENT: Negative.   Eyes: Negative.   Respiratory: Negative.   Cardiovascular: Negative.   Gastrointestinal: Negative.   Genitourinary: Negative.   Musculoskeletal: Negative.   Skin: Negative.   Neurological: Negative.   Hematological: Negative.   Psychiatric/Behavioral: Negative.        Objective:   Physical Exam  Constitutional: He is oriented to person, place, and time. He appears well-developed and well-nourished.  HENT:  Head: Normocephalic and atraumatic.  Right Ear: External ear normal.  Left Ear: External ear normal.  Nose: Nose normal.  Mouth/Throat: Oropharynx is clear and moist.  Eyes: Conjunctivae and EOM are normal. Pupils are equal, round, and reactive to light.  Neck: Normal range of motion. Neck supple. No JVD present. No tracheal deviation present. No thyromegaly present.  Cardiovascular: Normal rate, regular rhythm, normal heart sounds and intact distal pulses.  Exam reveals no gallop and no friction rub.   No murmur heard. Pulmonary/Chest: Effort normal and breath sounds normal. No stridor. No respiratory distress. He has no wheezes. He has no rales. He exhibits no tenderness.  Abdominal: Soft. Bowel sounds are normal. He exhibits no distension and no mass. There is no  tenderness. There is no rebound and no guarding.  Genitourinary: Rectum normal, prostate normal and penis normal. Guaiac negative stool. No penile tenderness.  Musculoskeletal: Normal range of motion. He exhibits no edema and no tenderness.  Lymphadenopathy:    He has no cervical adenopathy.  Neurological: He is alert and oriented to person, place, and time. He has normal reflexes. No cranial nerve deficit. He exhibits normal muscle tone.  Skin: Skin is warm and dry. No rash noted. No erythema. No pallor.  Psychiatric: He has a normal mood and affect. His behavior is normal. Judgment and thought content normal.          Assessment & Plan:  Healthy male.  Hypothyroidism continue Synthroid daily.  Erectile dysfunction continue Viagra p.r.n.  Hyperlipidemia.  Continue Zocor 40 mg daily add an aspirin tablet.  Family history colon polyps recommend colonoscopy.  Flu shot given today.  Tetanus posterior 2005

## 2012-01-03 ENCOUNTER — Encounter: Payer: Self-pay | Admitting: Gastroenterology

## 2012-05-19 ENCOUNTER — Encounter: Payer: Self-pay | Admitting: Family

## 2012-05-19 ENCOUNTER — Telehealth: Payer: Self-pay | Admitting: Family Medicine

## 2012-05-19 ENCOUNTER — Ambulatory Visit (INDEPENDENT_AMBULATORY_CARE_PROVIDER_SITE_OTHER): Payer: 59 | Admitting: Family

## 2012-05-19 ENCOUNTER — Ambulatory Visit (INDEPENDENT_AMBULATORY_CARE_PROVIDER_SITE_OTHER)
Admission: RE | Admit: 2012-05-19 | Discharge: 2012-05-19 | Disposition: A | Discharge status: Home / Self Care | Payer: 59 | Source: Ambulatory Visit | Attending: Family | Admitting: Family

## 2012-05-19 VITALS — BP 100/60 | HR 77 | Temp 98.7°F | Wt 273.0 lb

## 2012-05-19 DIAGNOSIS — E039 Hypothyroidism, unspecified: Secondary | ICD-10-CM

## 2012-05-19 DIAGNOSIS — R1032 Left lower quadrant pain: Secondary | ICD-10-CM

## 2012-05-19 LAB — POCT URINALYSIS DIPSTICK
Bilirubin, UA: NEGATIVE
Blood, UA: NEGATIVE
Leukocytes, UA: NEGATIVE
Nitrite, UA: NEGATIVE
Protein, UA: NEGATIVE
Urobilinogen, UA: 0.2
pH, UA: 7

## 2012-05-19 LAB — TSH: TSH: 4.41 u[IU]/mL (ref 0.35–5.50)

## 2012-05-19 LAB — CBC WITH DIFFERENTIAL/PLATELET
Basophils Absolute: 0 10*3/uL (ref 0.0–0.1)
Basophils Relative: 0.5 % (ref 0.0–3.0)
Eosinophils Absolute: 0.1 10*3/uL (ref 0.0–0.7)
Hemoglobin: 14.5 g/dL (ref 13.0–17.0)
Lymphocytes Relative: 29.1 % (ref 12.0–46.0)
MCHC: 34.1 g/dL (ref 30.0–36.0)
MCV: 90.3 fl (ref 78.0–100.0)
Monocytes Absolute: 0.7 10*3/uL (ref 0.1–1.0)
Neutro Abs: 4.5 10*3/uL (ref 1.4–7.7)
Neutrophils Relative %: 60.3 % (ref 43.0–77.0)
RDW: 13.4 % (ref 11.5–14.6)

## 2012-05-19 LAB — BASIC METABOLIC PANEL
BUN: 16 mg/dL (ref 6–23)
Chloride: 103 mEq/L (ref 96–112)
Creatinine, Ser: 1.1 mg/dL (ref 0.4–1.5)
GFR: 79.49 mL/min (ref >60.00)

## 2012-05-19 IMAGING — CR DG ABDOMEN 1V
2 series · 2 of 2 positions shown · non-contrast
Comparison: None.

CLINICAL DATA: Left lower quadrant abdominal pain for 2 days.
History of mild constipation.  History of previous hernia surgery.

ABDOMEN - 1 VIEW

[view not recorded (1 of 2)]
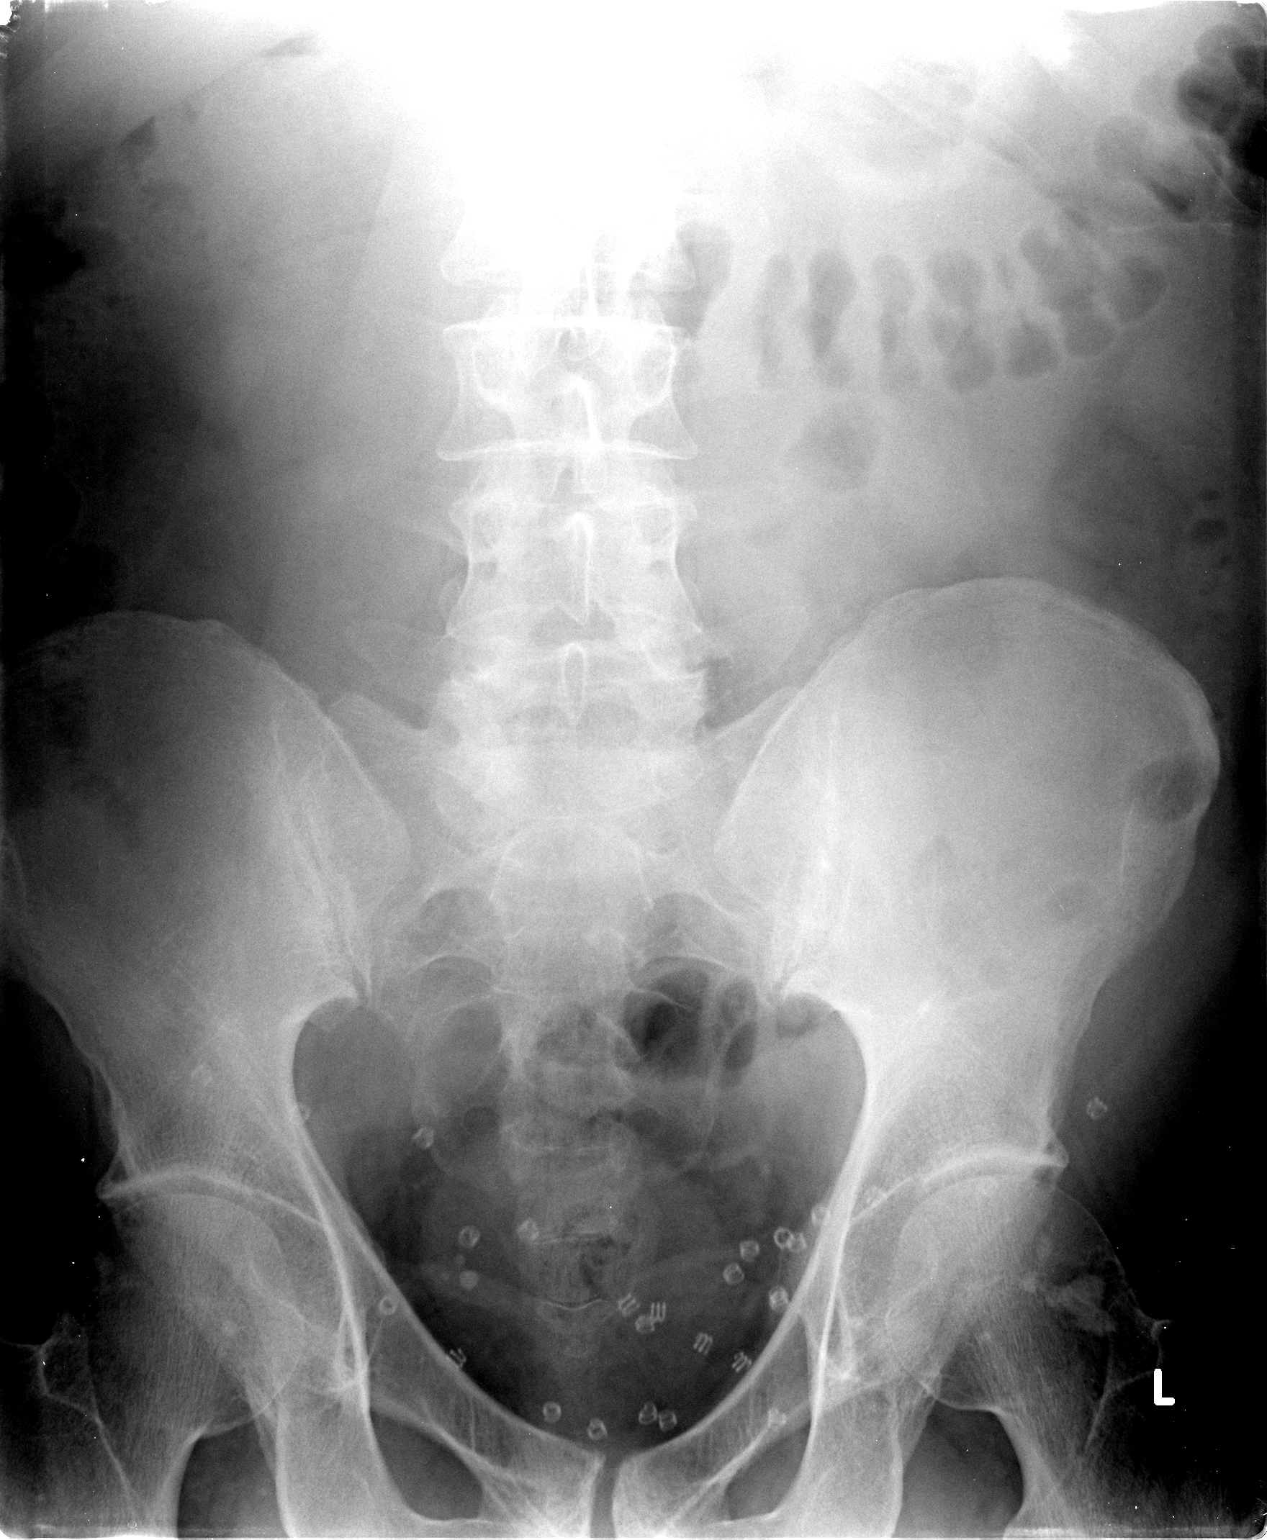

[view not recorded (2 of 2)]
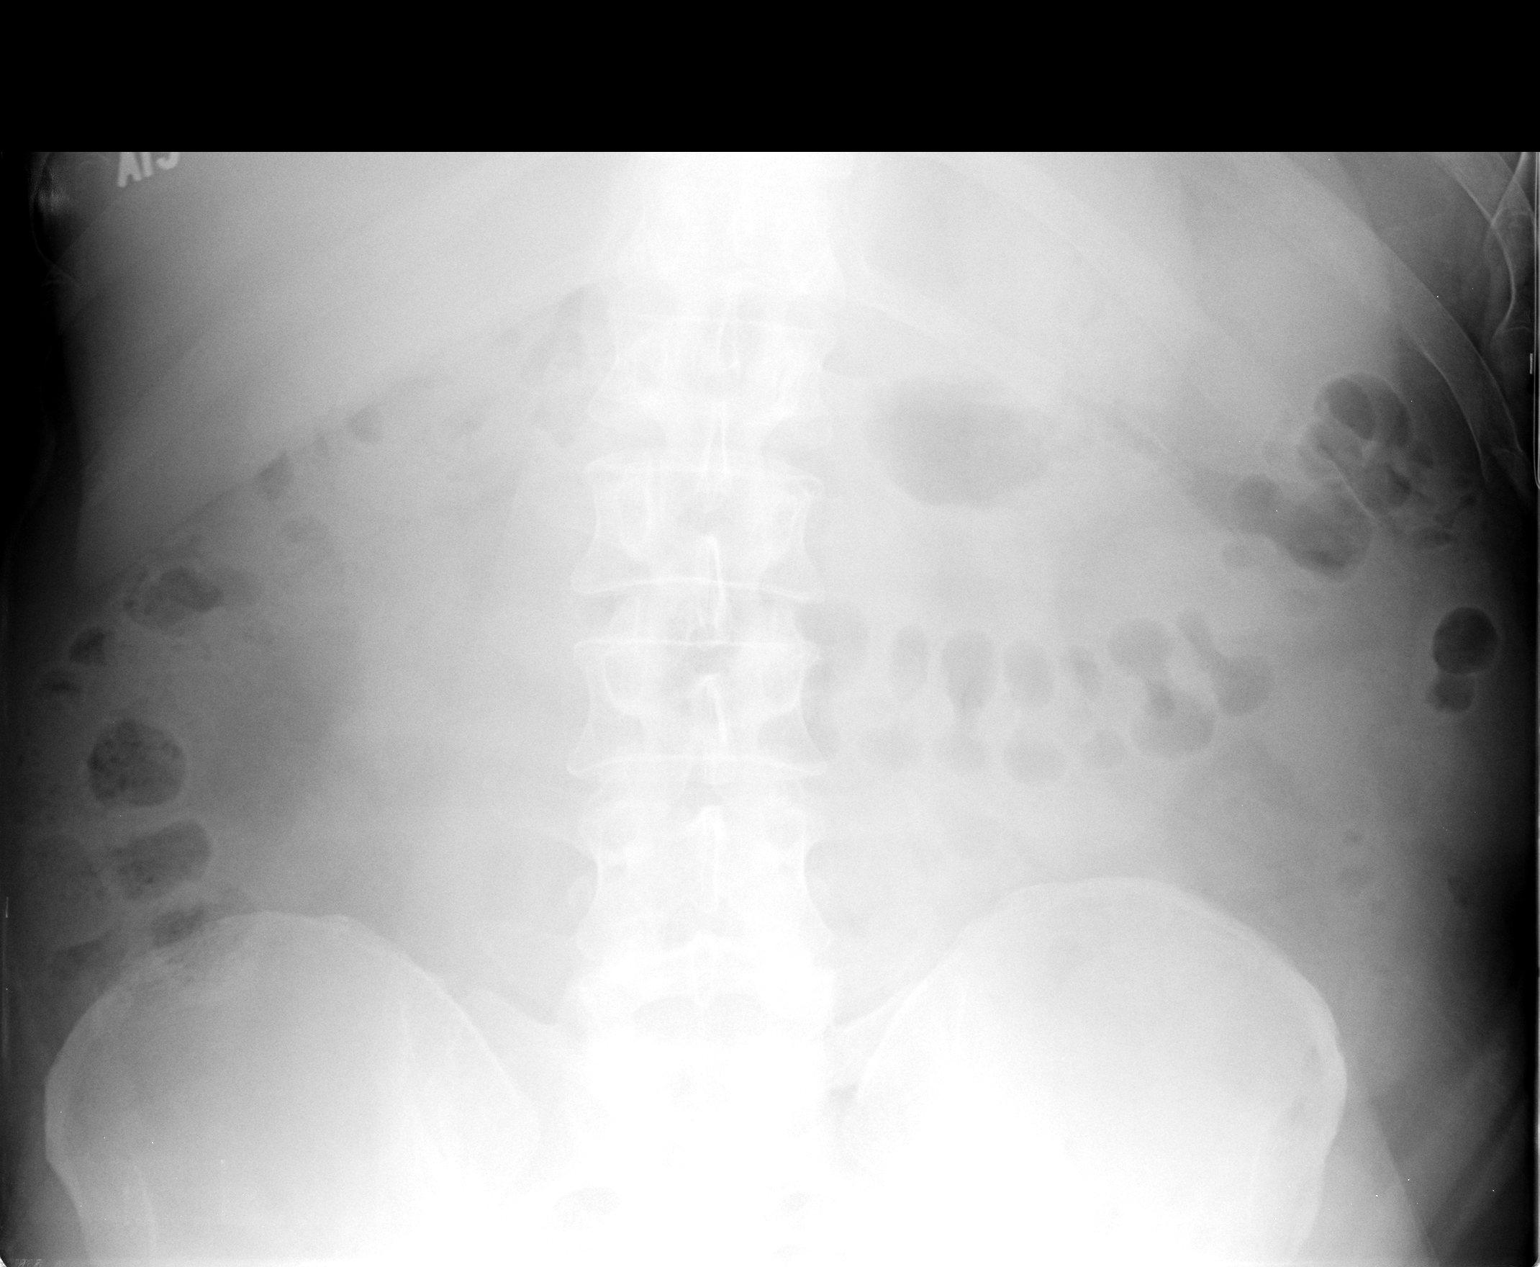

[2 of 2 positions shown; findings below may reference images not displayed]

FINDINGS: Multiple hernia repair clips are seen in the pelvis.
Bowel gas pattern is normal.  There is moderate fecal distention of
the colon in the area of the hepatic flexure.  Small bowel pattern
is normal.  No opaque calculi are seen. Bones appear average for
age.
IMPRESSION: Moderate fecal distention of portions of the colon in the area of
the hepatic flexure.

## 2012-05-19 NOTE — Telephone Encounter (Signed)
Pt requesting results of xray °

## 2012-05-19 NOTE — Progress Notes (Signed)
Subjective:    Patient ID: Cory Moore, male    DOB: Oct 07, 1961, 51 y.o.   MRN: [id number]  HPI 51 year old white male, nonsmoker, patient of Dr. Tawanna Cooler is in today with complaints of left lower quadrant abdominal pain x2 days. Recent pain is a 5-6/10, describes it as a dull ache that comes and goes. Believes it eating makes it worse. Patient denies any urinary frequency, urgency, burning with urination, back pain, fever, muscle aches or chills. He has a history of appendectomy, and bilateral inguinal hernia repair 10 years ago.   Review of Systems  Constitutional: Negative.   HENT: Negative.   Eyes: Negative.   Respiratory: Negative.   Cardiovascular: Negative.   Gastrointestinal: Positive for abdominal pain. Negative for nausea, diarrhea, constipation and rectal pain.  Genitourinary: Negative.   Musculoskeletal: Negative.   Skin: Negative.   Neurological: Negative.   Hematological: Negative.   Psychiatric/Behavioral: Negative.    Past Medical History  Diagnosis Date  . Anxiety   . Hyperlipidemia   . ED (erectile dysfunction)   . Thyroid disease   . Tobacco abuse     History   Social History  . Marital Status: Married    Spouse Name: N/A    Number of Children: N/A  . Years of Education: N/A   Occupational History  . Not on file.   Social History Main Topics  . Smoking status: Former Games developer  . Smokeless tobacco: Not on file  . Alcohol Use: No  . Drug Use: No  . Sexually Active:    Other Topics Concern  . Not on file   Social History Narrative  . No narrative on file    Past Surgical History  Procedure Date  . Hernia repair     Family History  Problem Relation Age of Onset  . Hyperlipidemia Other   . Hypertension Other     No Known Allergies  Current Outpatient Prescriptions on File Prior to Visit  Medication Sig Dispense Refill  . levothyroxine (SYNTHROID, LEVOTHROID) 100 MCG tablet Take 1 tablet (100 mcg total) by mouth daily.  100 tablet  3    . sildenafil (VIAGRA) 50 MG tablet Take 1 tablet (50 mg total) by mouth as needed for erectile dysfunction.  6 tablet  11  . simvastatin (ZOCOR) 40 MG tablet Take 1 tablet (40 mg total) by mouth every evening.  100 tablet  3    BP 100/60  Pulse 77  Temp 98.7 F (37.1 C) (Oral)  Wt 273 lb (123.832 kg)  SpO2 97%chart     Objective:   Physical Exam  Constitutional: He is oriented to person, place, and time. He appears well-developed and well-nourished.  HENT:  Right Ear: External ear normal.  Left Ear: External ear normal.  Nose: Nose normal.  Mouth/Throat: Oropharynx is clear and moist.  Neck: Normal range of motion. Neck supple.  Cardiovascular: Normal rate, regular rhythm and normal heart sounds.   Pulmonary/Chest: Effort normal and breath sounds normal.  Abdominal: Soft. There is tenderness. There is no rebound and no guarding.       Left lower quadrant  Musculoskeletal: Normal range of motion.  Neurological: He is alert and oriented to person, place, and time.  Skin: Skin is warm and dry.  Psychiatric: He has a normal mood and affect.     Urine dipstick: Within normal limits     Assessment & Plan:  Assessment: Left lower Quadrant Abdominal Pain  Plan: Will obtain a KUB to rule out  constipation. Lab sent to include CBC and BMP will notify patient pending results. We'll discuss further treatment plan after labs and KUB is obtained. Recheck when necessary sooner.

## 2012-05-19 NOTE — Telephone Encounter (Signed)
Caller: Brian/Patient; PCP: Adline Mango; CB#: [phone]; ; ; Call regarding Labs;  Pt calling regarding labs and abd xray, per EP[facility] EMR notified pt of Adline Mango PA notes, labs stable and continue current meds and per abd xray moderate constipation and mag citrate 1 bottle po x 1, pt notified. Please call pts cell if he needs to do anything else [phone].

## 2012-05-19 NOTE — Patient Instructions (Addendum)

## 2012-05-19 NOTE — Addendum Note (Signed)
Addended by: Beverely Low on: 05/19/2012 10:47 AM   Modules accepted: Orders

## 2012-08-10 ENCOUNTER — Encounter: Payer: Self-pay | Admitting: Gastroenterology

## 2012-09-18 ENCOUNTER — Ambulatory Visit (AMBULATORY_SURGERY_CENTER): Payer: 59 | Admitting: *Deleted

## 2012-09-18 ENCOUNTER — Encounter: Payer: Self-pay | Admitting: Gastroenterology

## 2012-09-18 VITALS — Ht 76.0 in | Wt 270.0 lb

## 2012-09-18 DIAGNOSIS — Z1211 Encounter for screening for malignant neoplasm of colon: Secondary | ICD-10-CM

## 2012-09-18 MED ORDER — MOVIPREP 100 G PO SOLR: ORAL | Status: DC

## 2012-10-02 ENCOUNTER — Ambulatory Visit (AMBULATORY_SURGERY_CENTER): Payer: 59 | Admitting: Gastroenterology

## 2012-10-02 ENCOUNTER — Encounter: Payer: Self-pay | Admitting: Gastroenterology

## 2012-10-02 VITALS — BP 116/64 | HR 53 | Temp 97.4°F | Resp 36 | Ht 76.0 in | Wt 270.0 lb

## 2012-10-02 DIAGNOSIS — D126 Benign neoplasm of colon, unspecified: Secondary | ICD-10-CM

## 2012-10-02 DIAGNOSIS — K5730 Diverticulosis of large intestine without perforation or abscess without bleeding: Secondary | ICD-10-CM

## 2012-10-02 DIAGNOSIS — Z1211 Encounter for screening for malignant neoplasm of colon: Secondary | ICD-10-CM

## 2012-10-02 MED ORDER — SODIUM CHLORIDE 0.9 % IV SOLN: 500.0000 mL | INTRAVENOUS | Status: DC

## 2012-10-02 NOTE — Op Note (Signed)
 [facility] [address] [place], [address]   COLONOSCOPY PROCEDURE REPORT  PATIENT: Cory, Moore  MR#: [id number] BIRTHDATE: 08-17-1961 , 51  yrs. old GENDER: Male ENDOSCOPIST: Mardella Layman, MD, Clementeen Graham REFERRED BY:  Roderick Pee, M.D. PROCEDURE DATE:  10/02/2012 PROCEDURE:   Colonoscopy with snare polypectomy ASA CLASS:   Class II INDICATIONS:average risk patient for colon cancer. MEDICATIONS: Propofol (Diprivan) 180 mg IV  DESCRIPTION OF PROCEDURE:   After the risks and benefits and of the procedure were explained, informed consent was obtained.  A digital rectal exam revealed no abnormalities of the rectum.    The LB [id number]  endoscope was introduced through the anus and advanced to the cecum, which was identified by both the appendix and ileocecal valve .  The quality of the prep was good, using MoviPrep .  The instrument was then slowly withdrawn as the colon was fully examined.     COLON FINDINGS: A polypoid shaped and firm pedunculated polyp measuring 1.5 cm in size was found in the sigmoid colon.  A polypectomy was performed using snare cautery.  The resection was complete and the polyp tissue was completely retrieved.   There was moderate diverticulosis noted in the descending colon and sigmoid colon with associated muscular hypertrophy and petechiae.   A smooth sessile polyp ranging between 3-15mm in size was found in the rectum.  A polypectomy was performed using snare cautery.  The resection was complete and the polyp tissue was completely retrieved.   The colon mucosa was otherwise normal.     Retroflexed views revealed no abnormalities.     The scope was then withdrawn from the patient and the procedure completed.  COMPLICATIONS: There were no complications. ENDOSCOPIC IMPRESSION: 1.   Pedunculated polyp measuring 1.5 cm in size was found in the sigmoid colon; polypectomy was performed using snare cautery  .Marland KitchenMarland KitchenR/O ADENOMA... 2.   There was moderate diverticulosis noted in the descending colon and sigmoid colon 3.   Sessile polyp ranging between 3-35mm in size was found in the rectum; polypectomy was performed using snare cautery 4.   The colon mucosa was otherwise normal  RECOMMENDATIONS: 1.  Repeat colonoscopy in 5 years if polyp adenomatous; otherwise 10 years 2.  High fiber diet   REPEAT EXAM:  cc:  _______________________________ eSignedMardella Layman, MD, [facility] 10/02/2012 10:24 AM     PATIENT NAME:  Cory, Moore MR#: [id number]

## 2012-10-02 NOTE — Progress Notes (Signed)
Patient did not experience any of the following events: a burn prior to discharge; a fall within the facility; wrong site/side/patient/procedure/implant event; or a hospital transfer or hospital admission upon discharge from the facility. (G8907) Patient did not have preoperative order for IV antibiotic SSI prophylaxis. (G8918)  

## 2012-10-02 NOTE — Patient Instructions (Addendum)
Discharge instructions given with verbal understanding. Handout on polyp given. Resume previous medications. YOU HAD AN ENDOSCOPIC PROCEDURE TODAY AT THE [facility]: Refer to the procedure report that was given to you for any specific questions about what was found during the examination.  If the procedure report does not answer your questions, please call your gastroenterologist to clarify.  If you requested that your care partner not be given the details of your procedure findings, then the procedure report has been included in a sealed envelope for you to review at your convenience later.  YOU SHOULD EXPECT: Some feelings of bloating in the abdomen. Passage of more gas than usual.  Walking can help get rid of the air that was put into your GI tract during the procedure and reduce the bloating. If you had a lower endoscopy (such as a colonoscopy or flexible sigmoidoscopy) you may notice spotting of blood in your stool or on the toilet paper. If you underwent a bowel prep for your procedure, then you may not have a normal bowel movement for a few days.  DIET: Your first meal following the procedure should be a light meal and then it is ok to progress to your normal diet.  A half-sandwich or bowl of soup is an example of a good first meal.  Heavy or fried foods are harder to digest and may make you feel nauseous or bloated.  Likewise meals heavy in dairy and vegetables can cause extra gas to form and this can also increase the bloating.  Drink plenty of fluids but you should avoid alcoholic beverages for 24 hours.  ACTIVITY: Your care partner should take you home directly after the procedure.  You should plan to take it easy, moving slowly for the rest of the day.  You can resume normal activity the day after the procedure however you should NOT DRIVE or use heavy machinery for 24 hours (because of the sedation medicines used during the test).    SYMPTOMS TO REPORT IMMEDIATELY: A  gastroenterologist can be reached at any hour.  During normal business hours, 8:30 AM to 5:00 PM Monday through Friday, call [phone].  After hours and on weekends, please call the GI answering service at [phone] who will take a message and have the physician on call contact you.   Following lower endoscopy (colonoscopy or flexible sigmoidoscopy):  Excessive amounts of blood in the stool  Significant tenderness or worsening of abdominal pains  Swelling of the abdomen that is new, acute  Fever of 100F or higher   FOLLOW UP: If any biopsies were taken you will be contacted by phone or by letter within the next 1-3 weeks.  Call your gastroenterologist if you have not heard about the biopsies in 3 weeks.  Our staff will call the home number listed on your records the next business day following your procedure to check on you and address any questions or concerns that you may have at that time regarding the information given to you following your procedure. This is a courtesy call and so if there is no answer at the home number and we have not heard from you through the emergency physician on call, we will assume that you have returned to your regular daily activities without incident.  SIGNATURES/CONFIDENTIALITY: You and/or your care partner have signed paperwork which will be entered into your electronic medical record.  These signatures attest to the fact that that the information above on your After Visit Summary   has been reviewed and is understood.  Full responsibility of the confidentiality of this discharge information lies with you and/or your care-partner.  

## 2012-10-03 ENCOUNTER — Telehealth: Payer: Self-pay

## 2012-10-03 NOTE — Telephone Encounter (Signed)
  Follow up Call-  Call back number 10/02/2012  Post procedure Call Back phone  # [phone]  Permission to leave phone message Yes     Patient questions:  Do you have a fever, pain , or abdominal swelling? no Pain Score  0 *  Have you tolerated food without any problems? yes  Have you been able to return to your normal activities? yes  Do you have any questions about your discharge instructions: Diet   no Medications  no Follow up visit  no  Do you have questions or concerns about your Care? no  Actions: * If pain score is 4 or above: No action needed, pain <4.  No problems noted. Maw

## 2012-10-06 ENCOUNTER — Encounter: Payer: Self-pay | Admitting: Gastroenterology

## 2012-11-24 ENCOUNTER — Other Ambulatory Visit: Payer: Self-pay | Admitting: Family Medicine

## 2012-12-01 ENCOUNTER — Other Ambulatory Visit (INDEPENDENT_AMBULATORY_CARE_PROVIDER_SITE_OTHER): Payer: 59

## 2012-12-01 DIAGNOSIS — Z0000 Encounter for general adult medical examination without abnormal findings: Secondary | ICD-10-CM

## 2012-12-01 LAB — CBC WITH DIFFERENTIAL/PLATELET
Basophils Absolute: 0 10*3/uL (ref 0.0–0.1)
Hemoglobin: 15 g/dL (ref 13.0–17.0)
Lymphocytes Relative: 52.2 % — ABNORMAL HIGH (ref 12.0–46.0)
Monocytes Relative: 8.8 % (ref 3.0–12.0)
Neutro Abs: 1.8 10*3/uL (ref 1.4–7.7)
Neutrophils Relative %: 36.2 % — ABNORMAL LOW (ref 43.0–77.0)
Platelets: 182 10*3/uL (ref 150.0–400.0)
RDW: 12.7 % (ref 11.5–14.6)

## 2012-12-01 LAB — POCT URINALYSIS DIPSTICK
Bilirubin, UA: NEGATIVE
Glucose, UA: NEGATIVE
Ketones, UA: NEGATIVE
Leukocytes, UA: NEGATIVE
Protein, UA: NEGATIVE

## 2012-12-01 LAB — LIPID PANEL
Cholesterol: 166 mg/dL (ref 0–200)
Triglycerides: 172 mg/dL — ABNORMAL HIGH (ref 0.0–149.0)
VLDL: 34.4 mg/dL (ref 0.0–40.0)

## 2012-12-01 LAB — HEPATIC FUNCTION PANEL
AST: 24 U/L (ref 0–37)
Alkaline Phosphatase: 53 U/L (ref 39–117)
Bilirubin, Direct: 0.1 mg/dL (ref 0.0–0.3)
Total Bilirubin: 0.9 mg/dL (ref 0.3–1.2)

## 2012-12-01 LAB — BASIC METABOLIC PANEL
Calcium: 9.4 mg/dL (ref 8.4–10.5)
GFR: 84.89 mL/min (ref >60.00)
Glucose, Bld: 98 mg/dL (ref 70–99)
Sodium: 138 mEq/L (ref 135–145)

## 2012-12-12 ENCOUNTER — Encounter: Payer: Self-pay | Admitting: Family Medicine

## 2012-12-12 ENCOUNTER — Ambulatory Visit (INDEPENDENT_AMBULATORY_CARE_PROVIDER_SITE_OTHER): Payer: 59 | Admitting: Family Medicine

## 2012-12-12 VITALS — BP 110/72 | Temp 98.5°F | Ht 76.75 in | Wt 280.0 lb

## 2012-12-12 DIAGNOSIS — R0602 Shortness of breath: Secondary | ICD-10-CM

## 2012-12-12 DIAGNOSIS — F528 Other sexual dysfunction not due to a substance or known physiological condition: Secondary | ICD-10-CM

## 2012-12-12 DIAGNOSIS — Z87891 Personal history of nicotine dependence: Secondary | ICD-10-CM | POA: Insufficient documentation

## 2012-12-12 DIAGNOSIS — E785 Hyperlipidemia, unspecified: Secondary | ICD-10-CM

## 2012-12-12 DIAGNOSIS — E039 Hypothyroidism, unspecified: Secondary | ICD-10-CM

## 2012-12-12 MED ORDER — SILDENAFIL CITRATE 50 MG PO TABS: 50.0000 mg | ORAL_TABLET | ORAL | Status: DC | PRN

## 2012-12-12 MED ORDER — LEVOTHYROXINE SODIUM 100 MCG PO TABS: ORAL_TABLET | ORAL | Status: DC

## 2012-12-12 MED ORDER — SIMVASTATIN 40 MG PO TABS: 40.0000 mg | ORAL_TABLET | Freq: Every evening | ORAL | Status: DC

## 2012-12-12 NOTE — Progress Notes (Signed)
  Subjective:    Patient ID: Cory Moore, male    DOB: [birth year], 52 y.o.   MRN: [id number]  HPI [name] is a 52 year old married male nonsmoker who comes in today for general physical examination because of a history of hypothyroidism, hyperlipidemia, and erectile dysfunction  He says that overall he feels well except for some shortness of breath for 4 months. He does not wake up at night short of breath. He says when he climbs stairs rapidly he gets short of breath. Weight is up to 280 pounds. He does not exercise on a regular basis. Denies any chest pain or other cardiac symptoms  He gets routine eye care, dental care, he had a colonoscopy last year which was normal except for some polyps he'll go back in 5 years for followup, tetanus 2005, seasonal flu shot   Review of Systems  Constitutional: Negative.   HENT: Negative.   Eyes: Negative.   Respiratory: Negative.   Cardiovascular: Negative.   Gastrointestinal: Negative.   Genitourinary: Negative.   Musculoskeletal: Negative.   Skin: Negative.   Neurological: Negative.   Hematological: Negative.   Psychiatric/Behavioral: Negative.        Objective:   Physical Exam  Constitutional: He is oriented to person, place, and time. He appears well-developed and well-nourished.  HENT:  Head: Normocephalic and atraumatic.  Right Ear: External ear normal.  Left Ear: External ear normal.  Nose: Nose normal.  Mouth/Throat: Oropharynx is clear and moist.  Eyes: Conjunctivae normal and EOM are normal. Pupils are equal, round, and reactive to light.  Neck: Normal range of motion. Neck supple. No JVD present. No tracheal deviation present. No thyromegaly present.  Cardiovascular: Normal rate, regular rhythm, normal heart sounds and intact distal pulses.  Exam reveals no gallop and no friction rub.   No murmur heard. Pulmonary/Chest: Effort normal and breath sounds normal. No stridor. No respiratory distress. He has no wheezes. He has no  rales. He exhibits no tenderness.  Abdominal: Soft. Bowel sounds are normal. He exhibits no distension and no mass. There is no tenderness. There is no rebound and no guarding.  Genitourinary: Rectum normal, prostate normal and penis normal. Guaiac negative stool. No penile tenderness.  Musculoskeletal: Normal range of motion. He exhibits no edema and no tenderness.  Lymphadenopathy:    He has no cervical adenopathy.  Neurological: He is alert and oriented to person, place, and time. He has normal reflexes. No cranial nerve deficit. He exhibits normal muscle tone.  Skin: Skin is warm and dry. No rash noted. No erythema. No pallor.  Psychiatric: He has a normal mood and affect. His behavior is normal. Judgment and thought content normal.   Total body skin exam normal,,,,,,,,, scar around the umbilicus and right lower quadrant from previous appendectomy       Assessment & Plan:  Healthy male  Hypothyroidism continue current dose of Synthroid  Hyperlipidemia continue Zocor and aspirin  History of erectile dysfunction continue Viagra when necessary  Shortness of breath with normal EKG and physical examination,,,,,,,,,,,, chest x-ray for evaluation

## 2012-12-12 NOTE — Patient Instructions (Addendum)
Continue your current medications  Followup in 1 year sooner if any problems  Begin a diet and exercise program  Go to the main office for a chest x-ray

## 2013-01-04 ENCOUNTER — Other Ambulatory Visit: Payer: Self-pay | Admitting: Family Medicine

## 2013-02-07 ENCOUNTER — Telehealth: Payer: Self-pay | Admitting: Family Medicine

## 2013-02-07 NOTE — Telephone Encounter (Signed)
We received notification from [facility] that this pt never went to [facility] for chest films ordered by Dr Tawanna Cooler on 12/12/12. Please follow up with patient or cancel order if appropriate. Thank you.

## 2013-02-07 NOTE — Telephone Encounter (Signed)
No orders to cancel

## 2013-02-23 ENCOUNTER — Telehealth: Payer: Self-pay | Admitting: Family Medicine

## 2013-02-23 NOTE — Telephone Encounter (Signed)
Rhonda from [facility] called to report a mis-fill. Patient is on of levothyroxine. However, last month when it was filled they accidentally only filled it for , and patient also did not notice. He has now been on that (1/2 dose) for 1 month.[facility] noticed this today, when they went to do a refill for pt. They have contacted pt - he is not upset, states he's felt sluggish, but chalked it up to turning 51.  Pharm is requesting that we contact pt to let him know if he needs OV or another set of thyroid labs. Also let him know if he can pick up the [facility] at [facility] and get back on correct dose. Also, please call [facility] to let them know if they are ok to fill for the for pt and he can start back on that.  Thank you.

## 2013-02-23 NOTE — Telephone Encounter (Signed)
Per Dr Tawanna Cooler. Okay to restart levothyroxine 100.  Spoke with pharmacy and Left message on machine for patient

## 2013-05-22 ENCOUNTER — Other Ambulatory Visit: Payer: Self-pay | Admitting: Family Medicine

## 2013-05-28 ENCOUNTER — Other Ambulatory Visit: Payer: Self-pay | Admitting: Family Medicine

## 2013-12-20 ENCOUNTER — Other Ambulatory Visit (INDEPENDENT_AMBULATORY_CARE_PROVIDER_SITE_OTHER): Payer: 59

## 2013-12-20 DIAGNOSIS — Z0000 Encounter for general adult medical examination without abnormal findings: Secondary | ICD-10-CM

## 2013-12-20 LAB — POCT URINALYSIS DIPSTICK
Bilirubin, UA: NEGATIVE
Blood, UA: NEGATIVE
GLUCOSE UA: NEGATIVE
Ketones, UA: NEGATIVE
LEUKOCYTES UA: NEGATIVE
NITRITE UA: NEGATIVE
Protein, UA: NEGATIVE
Spec Grav, UA: 1.015
UROBILINOGEN UA: 0.2
pH, UA: 7

## 2013-12-20 LAB — HEPATIC FUNCTION PANEL
ALBUMIN: 4.4 g/dL (ref 3.5–5.2)
ALT: 27 U/L (ref 0–53)
AST: 25 U/L (ref 0–37)
Alkaline Phosphatase: 50 U/L (ref 39–117)
BILIRUBIN TOTAL: 1 mg/dL (ref 0.3–1.2)
Bilirubin, Direct: 0.1 mg/dL (ref 0.0–0.3)
Total Protein: 7 g/dL (ref 6.0–8.3)

## 2013-12-20 LAB — BASIC METABOLIC PANEL
BUN: 15 mg/dL (ref 6–23)
CO2: 27 meq/L (ref 19–32)
CREATININE: 1 mg/dL (ref 0.4–1.5)
Calcium: 9.6 mg/dL (ref 8.4–10.5)
Chloride: 104 mEq/L (ref 96–112)
GFR: 79.87 mL/min (ref >60.00)
GLUCOSE: 90 mg/dL (ref 70–99)
Potassium: 4.3 mEq/L (ref 3.5–5.1)
Sodium: 137 mEq/L (ref 135–145)

## 2013-12-20 LAB — CBC WITH DIFFERENTIAL/PLATELET
BASOS PCT: 0.6 % (ref 0.0–3.0)
Basophils Absolute: 0 10*3/uL (ref 0.0–0.1)
EOS PCT: 2.5 % (ref 0.0–5.0)
Eosinophils Absolute: 0.1 10*3/uL (ref 0.0–0.7)
HCT: 46.7 % (ref 39.0–52.0)
HEMOGLOBIN: 15.7 g/dL (ref 13.0–17.0)
LYMPHS ABS: 2.6 10*3/uL (ref 0.7–4.0)
Lymphocytes Relative: 47.4 % — ABNORMAL HIGH (ref 12.0–46.0)
MCHC: 33.7 g/dL (ref 30.0–36.0)
MCV: 90.5 fl (ref 78.0–100.0)
MONOS PCT: 7.7 % (ref 3.0–12.0)
Monocytes Absolute: 0.4 10*3/uL (ref 0.1–1.0)
Neutro Abs: 2.3 10*3/uL (ref 1.4–7.7)
Neutrophils Relative %: 41.8 % — ABNORMAL LOW (ref 43.0–77.0)
Platelets: 202 10*3/uL (ref 150.0–400.0)
RBC: 5.16 Mil/uL (ref 4.22–5.81)
RDW: 13.2 % (ref 11.5–14.6)
WBC: 5.5 10*3/uL (ref 4.5–10.5)

## 2013-12-20 LAB — LIPID PANEL
CHOL/HDL RATIO: 5
Cholesterol: 182 mg/dL (ref 0–200)
HDL: 40 mg/dL (ref >39.00)
LDL Cholesterol: 106 mg/dL — ABNORMAL HIGH (ref 0–99)
Triglycerides: 182 mg/dL — ABNORMAL HIGH (ref 0.0–149.0)
VLDL: 36.4 mg/dL (ref 0.0–40.0)

## 2013-12-20 LAB — PSA: PSA: 0.81 ng/mL (ref 0.10–4.00)

## 2013-12-20 LAB — TSH: TSH: 5.7 u[IU]/mL — AB (ref 0.35–5.50)

## 2013-12-27 ENCOUNTER — Ambulatory Visit (INDEPENDENT_AMBULATORY_CARE_PROVIDER_SITE_OTHER): Payer: 59 | Admitting: Family Medicine

## 2013-12-27 ENCOUNTER — Encounter: Payer: Self-pay | Admitting: Family Medicine

## 2013-12-27 VITALS — BP 110/80 | Temp 98.5°F | Ht 76.75 in | Wt 281.0 lb

## 2013-12-27 DIAGNOSIS — E039 Hypothyroidism, unspecified: Secondary | ICD-10-CM

## 2013-12-27 DIAGNOSIS — Z23 Encounter for immunization: Secondary | ICD-10-CM

## 2013-12-27 DIAGNOSIS — E785 Hyperlipidemia, unspecified: Secondary | ICD-10-CM

## 2013-12-27 DIAGNOSIS — F528 Other sexual dysfunction not due to a substance or known physiological condition: Secondary | ICD-10-CM

## 2013-12-27 MED ORDER — VARDENAFIL HCL 20 MG PO TABS: 20.0000 mg | ORAL_TABLET | Freq: Every day | ORAL | Status: DC | PRN

## 2013-12-27 MED ORDER — SIMVASTATIN 40 MG PO TABS: 40.0000 mg | ORAL_TABLET | Freq: Every evening | ORAL | Status: DC

## 2013-12-27 MED ORDER — LEVOTHYROXINE SODIUM 125 MCG PO TABS: 125.0000 ug | ORAL_TABLET | Freq: Every day | ORAL | Status: DC

## 2013-12-27 NOTE — Patient Instructions (Signed)
Levitra 20 mg........ [email or web address]  Increase your Synthroid to 125 mcg daily  Zocor 40 mg daily...Marland KitchenMarland KitchenMarland Kitchen one baby aspirin also....  Return in one year sooner if any problems

## 2013-12-27 NOTE — Progress Notes (Signed)
   Subjective:    Patient ID: Cory Moore, male    DOB: Sep 08, 1961, 53 y.o.   MRN: [id number]  HPI Cory Moore is a 53 year old married male nonsmoker who comes in today for general physical exam because of a history of hypothyroidism, erectile dysfunction, hyperlipidemia  His med list is unchanged  He gets routine eye care, dental care, colonoscopy ,,,,,,, normal,,,,,, vaccinations up-to-date tetanus booster today   Review of Systems  Constitutional: Negative.   HENT: Negative.   Eyes: Negative.   Respiratory: Negative.   Cardiovascular: Negative.   Gastrointestinal: Negative.   Endocrine: Negative.   Genitourinary: Negative.   Musculoskeletal: Negative.   Skin: Negative.   Allergic/Immunologic: Negative.   Neurological: Negative.   Hematological: Negative.   Psychiatric/Behavioral: Negative.        Objective:   Physical Exam  Nursing note and vitals reviewed. Constitutional: He is oriented to person, place, and time. He appears well-developed and well-nourished.  HENT:  Head: Normocephalic and atraumatic.  Right Ear: External ear normal.  Left Ear: External ear normal.  Nose: Nose normal.  Mouth/Throat: Oropharynx is clear and moist.  Eyes: Conjunctivae and EOM are normal. Pupils are equal, round, and reactive to light.  Neck: Normal range of motion. Neck supple. No JVD present. No tracheal deviation present. No thyromegaly present.  Cardiovascular: Normal rate, regular rhythm, normal heart sounds and intact distal pulses.  Exam reveals no gallop and no friction rub.   No murmur heard. No carotid or artery bruits peripheral pulses 2+ and symmetrical  Pulmonary/Chest: Effort normal and breath sounds normal. No stridor. No respiratory distress. He has no wheezes. He has no rales. He exhibits no tenderness.  Abdominal: Soft. Bowel sounds are normal. He exhibits no distension and no mass. There is no tenderness. There is no rebound and no guarding.  Genitourinary: Rectum  normal, prostate normal and penis normal. Guaiac negative stool. No penile tenderness.  Musculoskeletal: Normal range of motion. He exhibits no edema and no tenderness.  Lymphadenopathy:    He has no cervical adenopathy.  Neurological: He is alert and oriented to person, place, and time. He has normal reflexes. No cranial nerve deficit. He exhibits normal muscle tone.  Skin: Skin is warm and dry. No rash noted. No erythema. No pallor.  Psychiatric: He has a normal mood and affect. His behavior is normal. Judgment and thought content normal.          Assessment & Plan:  Healthy male  Hypothyroidism continue Synthroid  Recti dysfunction switch to Levitra  Hyperlipidemia continue simvastatin baby aspirin

## 2013-12-27 NOTE — Progress Notes (Signed)
Pre visit review using our clinic review tool, if applicable. No additional management support is needed unless otherwise documented below in the visit note. 

## 2014-01-28 ENCOUNTER — Other Ambulatory Visit: Payer: Self-pay | Admitting: Family Medicine

## 2014-02-04 ENCOUNTER — Telehealth: Payer: Self-pay | Admitting: Family Medicine

## 2014-02-04 MED ORDER — LEVOTHYROXINE SODIUM 125 MCG PO TABS
125.0000 ug | ORAL_TABLET | Freq: Every day | ORAL | Status: DC
Start: 2014-02-04 — End: 2015-01-06

## 2014-02-04 NOTE — Telephone Encounter (Signed)
Pt is requesting a call back from the nurse states he has a question on the dosage of his rx levothyroxine (SYNTHROID, LEVOTHROID) 100 MCG tablet .

## 2014-09-06 ENCOUNTER — Telehealth: Payer: Self-pay | Admitting: Family Medicine

## 2014-09-06 MED ORDER — VARDENAFIL HCL 20 MG PO TABS: 20.0000 mg | ORAL_TABLET | Freq: Every day | ORAL | Status: DC | PRN

## 2014-09-06 NOTE — Telephone Encounter (Signed)
[id number] [place], [place] - [address]. AT [address] is vardenafil (LEVITRA) 20 MG tablet

## 2014-09-06 NOTE — Telephone Encounter (Signed)
Rx sent 

## 2014-09-09 ENCOUNTER — Telehealth: Payer: Self-pay | Admitting: Family Medicine

## 2014-09-09 NOTE — Telephone Encounter (Signed)
Pt states the vardenafil (LEVITRA) 20 MG tablet Is over $400. (10 pills) Is there an alternative? Pt willing to try viagra (or mailorder??)

## 2014-09-09 NOTE — Telephone Encounter (Signed)
Spoke with patient and he will call his insurance company and call back for a medication that they cover.

## 2014-09-10 ENCOUNTER — Telehealth: Payer: Self-pay | Admitting: Family Medicine

## 2014-09-10 MED ORDER — SILDENAFIL CITRATE 100 MG PO TABS: 50.0000 mg | ORAL_TABLET | Freq: Every day | ORAL | Status: DC | PRN

## 2014-09-10 NOTE — Telephone Encounter (Signed)
levitra is not covered by insurance but Viagra is .  Rx sent.

## 2014-09-10 NOTE — Telephone Encounter (Signed)
Pt would lie a cb to clarify something about the med Viagra that he is on.

## 2014-09-24 ENCOUNTER — Ambulatory Visit (INDEPENDENT_AMBULATORY_CARE_PROVIDER_SITE_OTHER): Payer: 59 | Admitting: Licensed Clinical Social Worker

## 2014-09-24 DIAGNOSIS — F4321 Adjustment disorder with depressed mood: Secondary | ICD-10-CM

## 2014-10-15 ENCOUNTER — Ambulatory Visit (INDEPENDENT_AMBULATORY_CARE_PROVIDER_SITE_OTHER): Payer: 59 | Admitting: Family Medicine

## 2014-10-15 ENCOUNTER — Encounter: Payer: Self-pay | Admitting: Family Medicine

## 2014-10-15 VITALS — BP 110/80 | Temp 97.6°F | Wt 267.0 lb

## 2014-10-15 DIAGNOSIS — K1329 Other disturbances of oral epithelium, including tongue: Secondary | ICD-10-CM

## 2014-10-15 NOTE — Patient Instructions (Signed)
Call [facility] nose [facility] and make an appointment to be seen ASAP

## 2014-10-15 NOTE — Progress Notes (Signed)
   Subjective:    Patient ID: Cory Moore, male    DOB: 1961-02-08, 53 y.o.   MRN: [id number]  HPI Cory Moore is a 53 year old married male nonsmoker and non-chewer who comes in today for evaluation of a lesion on the right side of his mucous membranes for about 2 weeks. About 2 weeks ago he noticed a lump there. It's not been bleeding. No history of trauma   Review of Systems    review of systems otherwise negative Objective:   Physical Exam  Well-developed well-nourished male no acute distress vital signs stable he is afebrile examination oral cavity shows a 3 mm to 4 mm pedunculated lesion with a yellow head right mucous membranes above the second molar      Assessment & Plan:  Oral lesion etiology unknown,,,,,,,,, refer to ENT for removal

## 2014-10-15 NOTE — Progress Notes (Signed)
Pre visit review using our clinic review tool, if applicable. No additional management support is needed unless otherwise documented below in the visit note. 

## 2014-12-31 ENCOUNTER — Other Ambulatory Visit (INDEPENDENT_AMBULATORY_CARE_PROVIDER_SITE_OTHER): Payer: 59

## 2014-12-31 DIAGNOSIS — Z0000 Encounter for general adult medical examination without abnormal findings: Secondary | ICD-10-CM

## 2014-12-31 LAB — CBC WITH DIFFERENTIAL/PLATELET
BASOS PCT: 0.8 % (ref 0.0–3.0)
Basophils Absolute: 0 10*3/uL (ref 0.0–0.1)
EOS PCT: 2.8 % (ref 0.0–5.0)
Eosinophils Absolute: 0.2 10*3/uL (ref 0.0–0.7)
HEMATOCRIT: 45.9 % (ref 39.0–52.0)
HEMOGLOBIN: 15.7 g/dL (ref 13.0–17.0)
Lymphocytes Relative: 48.7 % — ABNORMAL HIGH (ref 12.0–46.0)
Lymphs Abs: 3 10*3/uL (ref 0.7–4.0)
MCHC: 34.2 g/dL (ref 30.0–36.0)
MCV: 87.8 fl (ref 78.0–100.0)
MONO ABS: 0.5 10*3/uL (ref 0.1–1.0)
Monocytes Relative: 9 % (ref 3.0–12.0)
Neutro Abs: 2.4 10*3/uL (ref 1.4–7.7)
Neutrophils Relative %: 38.7 % — ABNORMAL LOW (ref 43.0–77.0)
Platelets: 185 10*3/uL (ref 150.0–400.0)
RBC: 5.23 Mil/uL (ref 4.22–5.81)
RDW: 13.1 % (ref 11.5–15.5)
WBC: 6.1 10*3/uL (ref 4.0–10.5)

## 2014-12-31 LAB — HEPATIC FUNCTION PANEL
ALBUMIN: 4.4 g/dL (ref 3.5–5.2)
ALT: 21 U/L (ref 0–53)
AST: 21 U/L (ref 0–37)
Alkaline Phosphatase: 52 U/L (ref 39–117)
BILIRUBIN DIRECT: 0.1 mg/dL (ref 0.0–0.3)
TOTAL PROTEIN: 6.7 g/dL (ref 6.0–8.3)
Total Bilirubin: 0.7 mg/dL (ref 0.2–1.2)

## 2014-12-31 LAB — POCT URINALYSIS DIP (MANUAL ENTRY)
Bilirubin, UA: NEGATIVE
Blood, UA: NEGATIVE
GLUCOSE UA: NEGATIVE
Ketones, POC UA: NEGATIVE
Leukocytes, UA: NEGATIVE
NITRITE UA: NEGATIVE
PROTEIN UA: NEGATIVE
Spec Grav, UA: <=1.005
Urobilinogen, UA: 0.2
pH, UA: 6

## 2014-12-31 LAB — TSH: TSH: 2.7 u[IU]/mL (ref 0.35–4.50)

## 2014-12-31 LAB — BASIC METABOLIC PANEL
BUN: 17 mg/dL (ref 6–23)
CO2: 30 mEq/L (ref 19–32)
Calcium: 9.3 mg/dL (ref 8.4–10.5)
Chloride: 104 mEq/L (ref 96–112)
Creatinine, Ser: 1.07 mg/dL (ref 0.40–1.50)
GFR: 76.98 mL/min (ref >60.00)
Glucose, Bld: 87 mg/dL (ref 70–99)
POTASSIUM: 4.4 meq/L (ref 3.5–5.1)
Sodium: 139 mEq/L (ref 135–145)

## 2014-12-31 LAB — LIPID PANEL
Cholesterol: 166 mg/dL (ref 0–200)
HDL: 39.9 mg/dL (ref >39.00)
LDL Cholesterol: 98 mg/dL (ref 0–99)
NONHDL: 126.1
TRIGLYCERIDES: 139 mg/dL (ref 0.0–149.0)
Total CHOL/HDL Ratio: 4
VLDL: 27.8 mg/dL (ref 0.0–40.0)

## 2014-12-31 LAB — PSA: PSA: 0.36 ng/mL (ref 0.10–4.00)

## 2015-01-06 ENCOUNTER — Ambulatory Visit (INDEPENDENT_AMBULATORY_CARE_PROVIDER_SITE_OTHER): Payer: 59 | Admitting: Family Medicine

## 2015-01-06 ENCOUNTER — Encounter: Payer: Self-pay | Admitting: Family Medicine

## 2015-01-06 VITALS — BP 110/80 | Temp 98.0°F | Ht 77.0 in | Wt 269.0 lb

## 2015-01-06 DIAGNOSIS — E038 Other specified hypothyroidism: Secondary | ICD-10-CM

## 2015-01-06 DIAGNOSIS — E785 Hyperlipidemia, unspecified: Secondary | ICD-10-CM

## 2015-01-06 DIAGNOSIS — F528 Other sexual dysfunction not due to a substance or known physiological condition: Secondary | ICD-10-CM

## 2015-01-06 MED ORDER — SIMVASTATIN 40 MG PO TABS: 40.0000 mg | ORAL_TABLET | Freq: Every evening | ORAL | Status: DC

## 2015-01-06 MED ORDER — LEVOTHYROXINE SODIUM 125 MCG PO TABS: 125.0000 ug | ORAL_TABLET | Freq: Every day | ORAL | Status: DC

## 2015-01-06 MED ORDER — SILDENAFIL CITRATE 100 MG PO TABS: 50.0000 mg | ORAL_TABLET | Freq: Every day | ORAL | Status: DC | PRN

## 2015-01-06 NOTE — Progress Notes (Signed)
   Subjective:    Patient ID: Cory Moore, male    DOB: 01-10-1961, 54 y.o.   MRN: [id number]  HPI Aaron Edelman is a 54 year old married male nonsmoker who comes in today for general physical examination because of a history of hyperlipidemia and hypothyroidism  He takes is Zocor and aspirin tablet daily total cholesterol 166 HDL 39 and LDL 98  He takes Synthroid 125 g daily TSH level within normal limits  He takes Viagra 100 mg dose one half tab when necessary for mild ED  He gets routine eye care, dental care, vaccinations up-to-date, colonoscopy 2013 normal.   Review of Systems  Constitutional: Negative.   HENT: Negative.   Eyes: Negative.   Respiratory: Negative.   Cardiovascular: Negative.   Gastrointestinal: Negative.   Endocrine: Negative.   Genitourinary: Negative.   Musculoskeletal: Negative.   Skin: Negative.   Allergic/Immunologic: Negative.   Neurological: Negative.   Hematological: Negative.   Psychiatric/Behavioral: Negative.        Objective:   Physical Exam  Constitutional: He is oriented to person, place, and time. He appears well-developed and well-nourished.  HENT:  Head: Normocephalic and atraumatic.  Right Ear: External ear normal.  Left Ear: External ear normal.  Nose: Nose normal.  Mouth/Throat: Oropharynx is clear and moist.  Eyes: Conjunctivae and EOM are normal. Pupils are equal, round, and reactive to light.  Neck: Normal range of motion. Neck supple. No JVD present. No tracheal deviation present. No thyromegaly present.  Cardiovascular: Normal rate, regular rhythm, normal heart sounds and intact distal pulses.  Exam reveals no gallop and no friction rub.   No murmur heard. Pulmonary/Chest: Effort normal and breath sounds normal. No stridor. No respiratory distress. He has no wheezes. He has no rales. He exhibits no tenderness.  Abdominal: Soft. Bowel sounds are normal. He exhibits no distension and no mass. There is no tenderness. There is no  rebound and no guarding.  Genitourinary: Rectum normal, prostate normal and penis normal. Guaiac negative stool. No penile tenderness.  Musculoskeletal: Normal range of motion. He exhibits no edema or tenderness.  Lymphadenopathy:    He has no cervical adenopathy.  Neurological: He is alert and oriented to person, place, and time. He has normal reflexes. No cranial nerve deficit. He exhibits normal muscle tone.  Skin: Skin is warm and dry. No rash noted. No erythema. No pallor.  Psychiatric: He has a normal mood and affect. His behavior is normal. Judgment and thought content normal.  Nursing note and vitals reviewed.         Assessment & Plan:  Healthy male  Hypothyroidism,,,,, continue Synthroid  Hyperlipidemia,,,,,, continue Zocor  Mild ED,,,,,,,,, Viagra 100 mg dose one half tab when necessary

## 2015-01-06 NOTE — Progress Notes (Signed)
Pre visit review using our clinic review tool, if applicable. No additional management support is needed unless otherwise documented below in the visit note. 

## 2015-01-06 NOTE — Patient Instructions (Signed)
Continue current medications  [email or web address]  Return in one year for general physical exam sooner if any problems  Dr. Yong Channel,,,,,,, and Dr. Maudie Mercury,,,,,,,,, are 2 new doctors will be covering while on gone

## 2015-01-22 ENCOUNTER — Other Ambulatory Visit: Payer: Self-pay | Admitting: Family Medicine

## 2016-01-05 ENCOUNTER — Other Ambulatory Visit (INDEPENDENT_AMBULATORY_CARE_PROVIDER_SITE_OTHER): Payer: 59

## 2016-01-05 DIAGNOSIS — Z0000 Encounter for general adult medical examination without abnormal findings: Secondary | ICD-10-CM | POA: Diagnosis not present

## 2016-01-05 LAB — CBC WITH DIFFERENTIAL/PLATELET
Basophils Absolute: 0 10*3/uL (ref 0.0–0.1)
Basophils Relative: 0.8 % (ref 0.0–3.0)
EOS PCT: 2.6 % (ref 0.0–5.0)
Eosinophils Absolute: 0.1 10*3/uL (ref 0.0–0.7)
HCT: 41.6 % (ref 39.0–52.0)
HEMOGLOBIN: 14.5 g/dL (ref 13.0–17.0)
LYMPHS PCT: 47.9 % — AB (ref 12.0–46.0)
Lymphs Abs: 2.2 10*3/uL (ref 0.7–4.0)
MCHC: 34.8 g/dL (ref 30.0–36.0)
MCV: 87.9 fl (ref 78.0–100.0)
MONOS PCT: 9.3 % (ref 3.0–12.0)
Monocytes Absolute: 0.4 10*3/uL (ref 0.1–1.0)
Neutro Abs: 1.8 10*3/uL (ref 1.4–7.7)
Neutrophils Relative %: 39.4 % — ABNORMAL LOW (ref 43.0–77.0)
Platelets: 182 10*3/uL (ref 150.0–400.0)
RBC: 4.74 Mil/uL (ref 4.22–5.81)
RDW: 12.9 % (ref 11.5–15.5)
WBC: 4.7 10*3/uL (ref 4.0–10.5)

## 2016-01-05 LAB — POC URINALSYSI DIPSTICK (AUTOMATED)
Bilirubin, UA: NEGATIVE
Blood, UA: NEGATIVE
Glucose, UA: NEGATIVE
Ketones, UA: NEGATIVE
Leukocytes, UA: NEGATIVE
Nitrite, UA: NEGATIVE
Protein, UA: NEGATIVE
Spec Grav, UA: 1.015
Urobilinogen, UA: 0.2
pH, UA: 6

## 2016-01-05 LAB — HEPATIC FUNCTION PANEL
ALBUMIN: 4.4 g/dL (ref 3.5–5.2)
ALT: 17 U/L (ref 0–53)
AST: 21 U/L (ref 0–37)
Alkaline Phosphatase: 41 U/L (ref 39–117)
Bilirubin, Direct: 0.1 mg/dL (ref 0.0–0.3)
Total Bilirubin: 0.7 mg/dL (ref 0.2–1.2)
Total Protein: 6.5 g/dL (ref 6.0–8.3)

## 2016-01-05 LAB — LIPID PANEL
Cholesterol: 155 mg/dL (ref 0–200)
HDL: 41.3 mg/dL (ref >39.00)
LDL Cholesterol: 92 mg/dL (ref 0–99)
NonHDL: 113.8
Total CHOL/HDL Ratio: 4
Triglycerides: 109 mg/dL (ref 0.0–149.0)
VLDL: 21.8 mg/dL (ref 0.0–40.0)

## 2016-01-05 LAB — BASIC METABOLIC PANEL
BUN: 14 mg/dL (ref 6–23)
CO2: 26 mEq/L (ref 19–32)
Calcium: 9.4 mg/dL (ref 8.4–10.5)
Chloride: 105 mEq/L (ref 96–112)
Creatinine, Ser: 0.96 mg/dL (ref 0.40–1.50)
GFR: 86.91 mL/min (ref >60.00)
Glucose, Bld: 97 mg/dL (ref 70–99)
POTASSIUM: 3.9 meq/L (ref 3.5–5.1)
SODIUM: 139 meq/L (ref 135–145)

## 2016-01-05 LAB — TSH: TSH: 4.96 u[IU]/mL — AB (ref 0.35–4.50)

## 2016-01-05 LAB — PSA: PSA: 0.42 ng/mL (ref 0.10–4.00)

## 2016-01-12 ENCOUNTER — Encounter: Payer: Self-pay | Admitting: Family Medicine

## 2016-01-12 ENCOUNTER — Ambulatory Visit (INDEPENDENT_AMBULATORY_CARE_PROVIDER_SITE_OTHER): Payer: 59 | Admitting: Family Medicine

## 2016-01-12 VITALS — BP 110/80 | Temp 97.9°F | Ht 77.0 in | Wt 264.0 lb

## 2016-01-12 DIAGNOSIS — E785 Hyperlipidemia, unspecified: Secondary | ICD-10-CM | POA: Diagnosis not present

## 2016-01-12 DIAGNOSIS — E038 Other specified hypothyroidism: Secondary | ICD-10-CM | POA: Diagnosis not present

## 2016-01-12 DIAGNOSIS — Z0000 Encounter for general adult medical examination without abnormal findings: Secondary | ICD-10-CM | POA: Diagnosis not present

## 2016-01-12 DIAGNOSIS — F528 Other sexual dysfunction not due to a substance or known physiological condition: Secondary | ICD-10-CM | POA: Diagnosis not present

## 2016-01-12 MED ORDER — SILDENAFIL CITRATE 100 MG PO TABS: 50.0000 mg | ORAL_TABLET | Freq: Every day | ORAL | Status: DC | PRN

## 2016-01-12 MED ORDER — LEVOTHYROXINE SODIUM 125 MCG PO TABS: 125.0000 ug | ORAL_TABLET | Freq: Every day | ORAL | Status: DC

## 2016-01-12 MED ORDER — SILDENAFIL CITRATE 20 MG PO TABS: 20.0000 mg | ORAL_TABLET | Freq: Three times a day (TID) | ORAL | Status: DC

## 2016-01-12 MED ORDER — SIMVASTATIN 40 MG PO TABS: 40.0000 mg | ORAL_TABLET | Freq: Every evening | ORAL | Status: DC

## 2016-01-12 NOTE — Progress Notes (Signed)
Pre visit review using our clinic review tool, if applicable. No additional management support is needed unless otherwise documented below in the visit note. 

## 2016-01-12 NOTE — Patient Instructions (Signed)
Continue current medication  There is a generic for Viagra,,,,,,  Call in September for your physical examination the fourth week in February  C[facility] or [facility] new adult nurse practitioner's or Dr. [place]

## 2016-01-12 NOTE — Progress Notes (Signed)
   Subjective:    Patient ID: Cory Moore, male    DOB: 1960/12/06, 55 y.o.   MRN: [id number]  HPI Aaron Edelman is a 55 year old married male nonsmoker who comes in today for general physical examination because of a history of hypothyroidism and hyperlipidemia  He takes Synthroid 125 g daily. He sometimes does remember to take it on an empty stomach. TSH is 4.9. Will not change dose just advised and take an empty stomach  Uses Zocor daily for hyperlipidemia lipids are at goal with a HDL 41 and LDL of 92.  He also uses Viagra when necessary for mild ED  He gets routine eye care, dental care, colonoscopy 2013 normal. Tetanus booster 2015. Declines a flu shot but actually in March. Just to light advised to get a flu shot in September October next year   Review of Systems  Constitutional: Negative.   HENT: Negative.   Eyes: Negative.   Respiratory: Negative.   Cardiovascular: Negative.   Gastrointestinal: Negative.   Endocrine: Negative.   Genitourinary: Negative.   Musculoskeletal: Negative.   Skin: Negative.   Allergic/Immunologic: Negative.   Neurological: Negative.   Hematological: Negative.   Psychiatric/Behavioral: Negative.        Objective:   Physical Exam  Constitutional: He is oriented to person, place, and time. He appears well-developed and well-nourished.  HENT:  Head: Normocephalic and atraumatic.  Right Ear: External ear normal.  Left Ear: External ear normal.  Nose: Nose normal.  Mouth/Throat: Oropharynx is clear and moist.  Eyes: Conjunctivae and EOM are normal. Pupils are equal, round, and reactive to light.  Neck: Normal range of motion. Neck supple. No JVD present. No tracheal deviation present. No thyromegaly present.  Cardiovascular: Normal rate, regular rhythm, normal heart sounds and intact distal pulses.  Exam reveals no gallop and no friction rub.   No murmur heard. No carotid nor aortic bruits for full pulses 2+ and symmetrical  Pulmonary/Chest:  Effort normal and breath sounds normal. No stridor. No respiratory distress. He has no wheezes. He has no rales. He exhibits no tenderness.  Abdominal: Soft. Bowel sounds are normal. He exhibits no distension and no mass. There is no tenderness. There is no rebound and no guarding.  Genitourinary: Rectum normal, prostate normal and penis normal. Guaiac negative stool. No penile tenderness.  Musculoskeletal: Normal range of motion. He exhibits no edema or tenderness.  Lymphadenopathy:    He has no cervical adenopathy.  Neurological: He is alert and oriented to person, place, and time. He has normal reflexes. No cranial nerve deficit. He exhibits normal muscle tone.  Skin: Skin is warm and dry. No rash noted. No erythema. No pallor.  Psychiatric: He has a normal mood and affect. His behavior is normal. Judgment and thought content normal.  Nursing note and vitals reviewed.         Assessment & Plan:  Healthy male  Hyperlipidemia at goal.....Marland Kitchen continue current therapy  Hypothyroidism......... continue current dose take on empty stomach  Mild ED.............Marland Kitchen refill and change to generic Viagra.

## 2016-01-31 ENCOUNTER — Other Ambulatory Visit: Payer: Self-pay | Admitting: Family Medicine

## 2016-11-04 ENCOUNTER — Other Ambulatory Visit: Payer: Self-pay | Admitting: Family Medicine

## 2017-01-26 ENCOUNTER — Other Ambulatory Visit: Payer: Self-pay | Admitting: Family Medicine

## 2017-01-26 DIAGNOSIS — E785 Hyperlipidemia, unspecified: Secondary | ICD-10-CM

## 2017-02-10 ENCOUNTER — Ambulatory Visit (INDEPENDENT_AMBULATORY_CARE_PROVIDER_SITE_OTHER): Payer: 59 | Admitting: Family Medicine

## 2017-02-10 ENCOUNTER — Encounter: Payer: Self-pay | Admitting: Family Medicine

## 2017-02-10 VITALS — BP 110/74 | HR 59 | Temp 97.7°F | Ht 77.0 in | Wt 269.6 lb

## 2017-02-10 DIAGNOSIS — Z87891 Personal history of nicotine dependence: Secondary | ICD-10-CM

## 2017-02-10 DIAGNOSIS — E785 Hyperlipidemia, unspecified: Secondary | ICD-10-CM | POA: Diagnosis not present

## 2017-02-10 DIAGNOSIS — Z125 Encounter for screening for malignant neoplasm of prostate: Secondary | ICD-10-CM

## 2017-02-10 DIAGNOSIS — E038 Other specified hypothyroidism: Secondary | ICD-10-CM

## 2017-02-10 DIAGNOSIS — F528 Other sexual dysfunction not due to a substance or known physiological condition: Secondary | ICD-10-CM

## 2017-02-10 DIAGNOSIS — Z0000 Encounter for general adult medical examination without abnormal findings: Secondary | ICD-10-CM

## 2017-02-10 LAB — CBC
HCT: 46 % (ref 39.0–52.0)
HEMOGLOBIN: 15.6 g/dL (ref 13.0–17.0)
MCHC: 34 g/dL (ref 30.0–36.0)
MCV: 89.3 fl (ref 78.0–100.0)
PLATELETS: 193 10*3/uL (ref 150.0–400.0)
RBC: 5.15 Mil/uL (ref 4.22–5.81)
RDW: 13 % (ref 11.5–15.5)
WBC: 5 10*3/uL (ref 4.0–10.5)

## 2017-02-10 LAB — COMPREHENSIVE METABOLIC PANEL
ALBUMIN: 4.7 g/dL (ref 3.5–5.2)
ALK PHOS: 47 U/L (ref 39–117)
ALT: 23 U/L (ref 0–53)
AST: 22 U/L (ref 0–37)
BILIRUBIN TOTAL: 0.6 mg/dL (ref 0.2–1.2)
BUN: 14 mg/dL (ref 6–23)
CALCIUM: 9.6 mg/dL (ref 8.4–10.5)
CO2: 27 mEq/L (ref 19–32)
Chloride: 104 mEq/L (ref 96–112)
Creatinine, Ser: 1.05 mg/dL (ref 0.40–1.50)
GFR: 78.04 mL/min (ref >60.00)
Glucose, Bld: 93 mg/dL (ref 70–99)
Potassium: 4.2 mEq/L (ref 3.5–5.1)
Sodium: 138 mEq/L (ref 135–145)
TOTAL PROTEIN: 6.9 g/dL (ref 6.0–8.3)

## 2017-02-10 LAB — POC URINALSYSI DIPSTICK (AUTOMATED)
Bilirubin, UA: NEGATIVE
Blood, UA: NEGATIVE
Glucose, UA: NEGATIVE
KETONES UA: NEGATIVE
Leukocytes, UA: NEGATIVE
Nitrite, UA: NEGATIVE
PROTEIN UA: NEGATIVE
SPEC GRAV UA: 1.01 (ref 1.030–1.035)
UROBILINOGEN UA: 0.2 (ref ?–2.0)
pH, UA: 6 (ref 5.0–8.0)

## 2017-02-10 LAB — LIPID PANEL
Cholesterol: 175 mg/dL (ref 0–200)
HDL: 43.9 mg/dL (ref >39.00)
LDL Cholesterol: 106 mg/dL — ABNORMAL HIGH (ref 0–99)
NONHDL: 130.68
TRIGLYCERIDES: 124 mg/dL (ref 0.0–149.0)
Total CHOL/HDL Ratio: 4
VLDL: 24.8 mg/dL (ref 0.0–40.0)

## 2017-02-10 LAB — PSA: PSA: 0.4 ng/mL (ref 0.10–4.00)

## 2017-02-10 LAB — TSH: TSH: 3.03 u[IU]/mL (ref 0.35–4.50)

## 2017-02-10 NOTE — Progress Notes (Signed)
Pre visit review using our clinic review tool, if applicable. No additional management support is needed unless otherwise documented below in the visit note. 

## 2017-02-10 NOTE — Progress Notes (Signed)
Phone: [phone]  Subjective:  Patient presents today for their annual physical. Prior patient of Dr. Sherren Mocha. Chief complaint-noted.   See problem oriented charting- ROS- full  review of systems was completed and negative except for: occasional shortness of breath when starts to hike but gets better as goes on. Mild knee pain at times. No chest pain. No headache or blurry vision.   The following were reviewed and entered/updated in epic: Past Medical History:  Diagnosis Date  . Anxiety    a lot of stressors. panic attacks in younger years.  . ED (erectile dysfunction)   . Hyperlipidemia   . Thyroid disease   . Tobacco abuse    Patient Active Problem List   Diagnosis Date Noted  . Hypothyroidism 10/02/2007    Priority: Medium  . Hyperlipidemia 10/02/2007    Priority: Medium  . Former smoker 12/12/2012    Priority: Low  . ERECTILE DYSFUNCTION 10/03/2008    Priority: Low   Past Surgical History:  Procedure Laterality Date  . APPENDECTOMY  1998  . HERNIA REPAIR  2003   right inguinal    Family History  Problem Relation Age of Onset  . Hypertension Mother   . Hyperlipidemia Mother   . Hypertension Father   . Hyperlipidemia Father   . Hyperlipidemia Sister   . Hyperlipidemia Brother     Medications- reviewed and updated Current Outpatient Prescriptions  Medication Sig Dispense Refill  . levothyroxine (SYNTHROID, LEVOTHROID) 125 MCG tablet Take 1 tablet (125 mcg total) by mouth daily. 100 tablet 3  . sildenafil (VIAGRA) 100 MG tablet Take 0.5-1 tablets (50-100 mg total) by mouth daily as needed for erectile dysfunction. 15 tablet 11  . simvastatin (ZOCOR) 40 MG tablet TAKE 1 TABLET (40 MG TOTAL) BY MOUTH EVERY EVENING. 100 tablet 2   No current facility-administered medications for this visit.     Allergies-reviewed and updated No Known Allergies  Social History   Social History  . Marital status: Married    Spouse name: N/A  . Number of children: N/A  .  Years of education: N/A   Social History Main Topics  . Smoking status: Former Smoker    Packs/day: 1.00    Years: 25.00    Quit date: 11/15/2002  . Smokeless tobacco: Never Used  . Alcohol use 1.2 oz/week    1 Glasses of wine, 1 Shots of liquor per week  . Drug use: No  . Sexual activity: Not Asked   Other Topics Concern  . None   Social History Narrative   Divorced- lives with girlfriend. Son 74  and daughter 49 (in [place]) in 2018.       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 Oropharynx normal Neck: no thyromegaly CV: RRR no murmurs rubs or gallops Lungs: CTAB no crackles, wheeze, rhonchi Abdomen: soft/nontender/nondistended/normal bowel sounds.  Ext: no edema Skin: warm, dry Neuro: grossly normal, moves all extremities, PERRLA Rectal: normal tone, normal sized prostate, no masses or tenderness  Assessment/Plan:  56 y.o. male  presenting for annual physical.  Health Maintenance counseling: 1. Anticipatory guidance: Patient counseled regarding regular dental exams - q6 monmths, eye exams - yearly, wearing seatbelts.  2. Risk factor reduction:  Advised patient of need for regular exercise and diet rich and fruits and vegetables to reduce risk of heart attack and stroke. Exercise- admits to needing more consistency- maybe a weekend hike. Discussed 150 minute goal. Diet- eats at home reasonable amount. He would like to lose at least 10-15 lbs . I discussed goal 250 or less to get BMI unde r30. Gets good fruits/veggies balance but overeats and likes his sweets.  Wt Readings  from Last 3 Encounters:  02/10/17 269 lb 9.6 oz (122.3 kg)  01/12/16 264 lb (119.7 kg)  01/06/15 269 lb (122 kg)  3. Immunizations/screenings/ancillary studies Immunization History  Administered Date(s) Administered  . Influenza Split 10/25/2011  . Td 11/16/2003  . Tdap 12/27/2013   Health Maintenance Due  Topic Date Due  . Hepatitis C Screening - gave blood to [facility] within 3 months [birth year]1962  . HIV Screening - gave blood to [facility] within 3 months 06/21/1977   4. Prostate cancer screening- low risk psa trend, update psa. Low risk rectal exam  Lab Results  Component Value Date   PSA 0.40 02/10/2017   PSA 0.42 01/05/2016   PSA 0.36 12/31/2014   5. Colon cancer screening - 10/02/12 with 5 year follow up due to polyp. He is aware to look out for letter from GI in november 6. Skin cancer screening- waist up exam normal  Status of chronic or acute concerns   Hypothyroidism Has been stable on Levothyroxine 178mcg. Will update tsh  Hyperlipidemia Lab Results  Component Value Date   CHOL 155 01/05/2016   HDL 41.30 01/05/2016   LDLCALC 92 01/05/2016   TRIG 109.0 01/05/2016   CHOLHDL 4 01/05/2016  reasonably controlled on Simvastatin 40mg . Update lipids  Former smoker Some SOB at beginning of hiking but improves as he goes. 25 pack years. Quit 2004. Occasional cigarette with stress- advised against   Return in about 1 year (around 02/10/2018) for physical.  Orders Placed This Encounter  Procedures  . CBC    [place]  . Comprehensive metabolic panel    [facility]    Order Specific Question:   Has the patient fasted?    Answer:   No  . Lipid panel    [facility]    Order Specific Question:   Has the patient fasted?    Answer:   No  . TSH      . PSA  . POCT Urinalysis Dipstick (Automated)   Return precautions advised.  Garret Reddish, MD

## 2017-02-10 NOTE — Patient Instructions (Addendum)
Please stop by lab before you go  I like your goal of trimming off 10-15 lbs in next year. Slow steady changes are usually best  Strongly advise no cigarettes- you have done such a great job quitting!

## 2017-02-10 NOTE — Assessment & Plan Note (Signed)
Has been stable on Levothyroxine 144mcg. Will update tsh

## 2017-02-10 NOTE — Assessment & Plan Note (Signed)
Some SOB at beginning of hiking but improves as he goes. 25 pack years. Quit 2004. Occasional cigarette with stress- advised against

## 2017-02-10 NOTE — Assessment & Plan Note (Signed)
Lab Results  Component Value Date   CHOL 155 01/05/2016   HDL 41.30 01/05/2016   LDLCALC 92 01/05/2016   TRIG 109.0 01/05/2016   CHOLHDL 4 01/05/2016  reasonably controlled on Simvastatin 40mg . Update lipids

## 2017-02-11 MED ORDER — LEVOTHYROXINE SODIUM 125 MCG PO TABS: 125.0000 ug | ORAL_TABLET | Freq: Every day | ORAL | 3 refills | Status: DC

## 2017-02-23 DIAGNOSIS — H5213 Myopia, bilateral: Secondary | ICD-10-CM | POA: Diagnosis not present

## 2017-04-22 ENCOUNTER — Other Ambulatory Visit: Payer: Self-pay | Admitting: Family Medicine

## 2017-04-24 ENCOUNTER — Other Ambulatory Visit: Payer: Self-pay | Admitting: Family Medicine

## 2017-07-26 ENCOUNTER — Other Ambulatory Visit: Payer: Self-pay | Admitting: Family Medicine

## 2017-10-17 ENCOUNTER — Encounter: Payer: Self-pay | Admitting: Internal Medicine

## 2017-10-21 ENCOUNTER — Other Ambulatory Visit: Payer: Self-pay | Admitting: Family Medicine

## 2017-10-21 DIAGNOSIS — E785 Hyperlipidemia, unspecified: Secondary | ICD-10-CM

## 2017-10-21 NOTE — Telephone Encounter (Signed)
Dr Ronney Lion pt.

## 2017-11-15 HISTORY — PX: COLONOSCOPY: SHX174

## 2017-12-21 ENCOUNTER — Encounter: Payer: Self-pay | Admitting: Internal Medicine

## 2018-01-09 DIAGNOSIS — L918 Other hypertrophic disorders of the skin: Secondary | ICD-10-CM | POA: Diagnosis not present

## 2018-03-02 ENCOUNTER — Encounter: Payer: Self-pay | Admitting: Internal Medicine

## 2018-03-02 ENCOUNTER — Other Ambulatory Visit: Payer: Self-pay

## 2018-03-02 ENCOUNTER — Ambulatory Visit (AMBULATORY_SURGERY_CENTER): Payer: 59 | Admitting: *Deleted

## 2018-03-02 VITALS — Ht 76.0 in | Wt 261.0 lb

## 2018-03-02 DIAGNOSIS — Z86010 Personal history of colonic polyps: Secondary | ICD-10-CM

## 2018-03-02 MED ORDER — NA SULFATE-K SULFATE-MG SULF 17.5-3.13-1.6 GM/177ML PO SOLN: 1.0000 | Freq: Once | ORAL | 0 refills | Status: AC

## 2018-03-02 NOTE — Progress Notes (Signed)
No egg or soy allergy known to patient  No issues with past sedation with any surgeries  or procedures, no intubation problems  No diet pills per patient No home 02 use per patient  No blood thinners per patient  Pt denies issues with constipation  No A fib or A flutter  EMMI video sent to pt's e mail  $15 coupon for Suprep given to pt today

## 2018-03-16 ENCOUNTER — Other Ambulatory Visit: Payer: Self-pay

## 2018-03-16 ENCOUNTER — Encounter: Payer: Self-pay | Admitting: Internal Medicine

## 2018-03-16 ENCOUNTER — Ambulatory Visit (AMBULATORY_SURGERY_CENTER): Payer: 59 | Admitting: Internal Medicine

## 2018-03-16 VITALS — BP 95/63 | HR 52 | Temp 98.9°F | Resp 17 | Ht 76.0 in | Wt 261.0 lb

## 2018-03-16 DIAGNOSIS — Z860100 Personal history of colon polyps, unspecified: Secondary | ICD-10-CM

## 2018-03-16 DIAGNOSIS — K635 Polyp of colon: Secondary | ICD-10-CM

## 2018-03-16 DIAGNOSIS — D122 Benign neoplasm of ascending colon: Secondary | ICD-10-CM | POA: Diagnosis not present

## 2018-03-16 DIAGNOSIS — D120 Benign neoplasm of cecum: Secondary | ICD-10-CM

## 2018-03-16 DIAGNOSIS — D125 Benign neoplasm of sigmoid colon: Secondary | ICD-10-CM | POA: Diagnosis not present

## 2018-03-16 DIAGNOSIS — D124 Benign neoplasm of descending colon: Secondary | ICD-10-CM | POA: Diagnosis not present

## 2018-03-16 DIAGNOSIS — Z1211 Encounter for screening for malignant neoplasm of colon: Secondary | ICD-10-CM | POA: Diagnosis not present

## 2018-03-16 DIAGNOSIS — D123 Benign neoplasm of transverse colon: Secondary | ICD-10-CM

## 2018-03-16 MED ORDER — SODIUM CHLORIDE 0.9 % IV SOLN: 500.0000 mL | Freq: Once | INTRAVENOUS | Status: DC

## 2018-03-16 NOTE — Patient Instructions (Signed)
Thank you for allowing [place] to care for you today!  Handouts for Polyps, Diverticulosis, and Hemorrhoids.  Await pathology results  NO NSAIDS (Ibuprofen, Alleve, Naproxen for 2 weeks)   YOU HAD AN ENDOSCOPIC PROCEDURE TODAY AT [facility]:   Refer to the procedure report that was given to you for any specific questions about what was found during the examination.  If the procedure report does not answer your questions, please call your gastroenterologist to clarify.  If you requested that your care partner not be given the details of your procedure findings, then the procedure report has been included in a sealed envelope for you to review at your convenience later.  YOU SHOULD EXPECT: Some feelings of bloating in the abdomen. Passage of more gas than usual.  Walking can help get rid of the air that was put into your GI tract during the procedure and reduce the bloating. If you had a lower endoscopy (such as a colonoscopy or flexible sigmoidoscopy) you may notice spotting of blood in your stool or on the toilet paper. If you underwent a bowel prep for your procedure, you may not have a normal bowel movement for a few days.  Please Note:  You might notice some irritation and congestion in your nose or some drainage.  This is from the oxygen used during your procedure.  There is no need for concern and it should clear up in a day or so.  SYMPTOMS TO REPORT IMMEDIATELY:   Following lower endoscopy (colonoscopy or flexible sigmoidoscopy):  Excessive amounts of blood in the stool  Significant tenderness or worsening of abdominal pains  Swelling of the abdomen that is new, acute  Fever of 100F or higher   For urgent or emergent issues, a gastroenterologist can be reached at any hour by calling [phone].   DIET:  We do recommend a small meal at first, but then you may proceed to your regular diet.  Drink plenty of fluids but you should avoid alcoholic beverages for 24  hours.  ACTIVITY:  You should plan to take it easy for the rest of today and you should NOT DRIVE or use heavy machinery until tomorrow (because of the sedation medicines used during the test).    FOLLOW UP: Our staff will call the number listed on your records the next business day following your procedure to check on you and address any questions or concerns that you may have regarding the information given to you following your procedure. If we do not reach you, we will leave a message.  However, if you are feeling well and you are not experiencing any problems, there is no need to return our call.  We will assume that you have returned to your regular daily activities without incident.  If any biopsies were taken you will be contacted by phone or by letter within the next 1-3 weeks.  Please call us at [phone] if you have not heard about the biopsies in 3 weeks.    SIGNATURES/CONFIDENTIALITY: You and/or your care partner have signed paperwork which will be entered into your electronic medical record.  These signatures attest to the fact that that the information above on your After Visit Summary has been reviewed and is understood.  Full responsibility of the confidentiality of this discharge information lies with you and/or your care-partner.

## 2018-03-16 NOTE — Progress Notes (Signed)
Called to room to assist during endoscopic procedure.  Patient ID and intended procedure confirmed with present staff. Received instructions for my participation in the procedure from the performing physician.  

## 2018-03-16 NOTE — Progress Notes (Signed)
To PACU, VSS. Report to [email or web address] 

## 2018-03-16 NOTE — Op Note (Signed)
[facility] Patient Name: Cory Moore Procedure Date: 03/16/2018 9:28 AM MRN: [id number] Endoscopist: Jerene Bears , MD Age: 57 Referring MD:  Date of Birth: September 26, 1961 Gender: Male Account #: [id number] Procedure:                Colonoscopy Indications:              Surveillance: Personal history of adenomatous                            polyps on last colonoscopy 5 years ago Medicines:                Monitored Anesthesia Care Procedure:                Pre-Anesthesia Assessment:                           - Prior to the procedure, a History and Physical                            was performed, and patient medications and                            allergies were reviewed. The patient's tolerance of                            previous anesthesia was also reviewed. The risks                            and benefits of the procedure and the sedation                            options and risks were discussed with the patient.                            All questions were answered, and informed consent                            was obtained. Prior Anticoagulants: The patient has                            taken no previous anticoagulant or antiplatelet                            agents. ASA Grade Assessment: II - A patient with                            mild systemic disease. After reviewing the risks                            and benefits, the patient was deemed in                            satisfactory condition to undergo the procedure.  After obtaining informed consent, the colonoscope                            was passed under direct vision. Throughout the                            procedure, the patient's blood pressure, pulse, and                            oxygen saturations were monitored continuously. The                            Model CF-HQ190L [id number]) scope was introduced                            through the anus and advanced  to the cecum,                            identified by appendiceal orifice and ileocecal                            valve. The colonoscopy was performed without                            difficulty. The patient tolerated the procedure                            well. The quality of the bowel preparation was                            good. The ileocecal valve, appendiceal orifice, and                            rectum were photographed. Scope In: 9:41:11 AM Scope Out: 9:56:51 AM Scope Withdrawal Time: 0 hours 13 minutes 46 seconds  Total Procedure Duration: 0 hours 15 minutes 40 seconds  Findings:                 The digital rectal exam was normal.                           A 7 mm polyp was found in the cecum. The polyp was                            sessile. The polyp was removed with a cold snare.                            Resection and retrieval were complete.                           A 5 mm polyp was found in the ascending colon. The                            polyp was sessile. The polyp  was removed with a                            cold snare. Resection and retrieval were complete.                           A 5 mm polyp was found in the hepatic flexure. The                            polyp was sessile.                           A 7 mm polyp was found in the transverse colon. The                            polyp was sessile. The polyp was removed with a                            cold snare. Resection and retrieval were complete.                           A 6 mm polyp was found in the descending colon. The                            polyp was sessile. The polyp was removed with a                            cold snare. Resection and retrieval were complete.                           A 10 mm polyp was found in the sigmoid colon. The                            polyp was semi-pedunculated. The polyp was removed                            with a hot snare. Resection and retrieval were                             complete.                           Multiple small-mouthed diverticula were found from                            transverse colon to sigmoid colon.                           Internal hemorrhoids and small anal skin tag were                            found during retroflexion. The hemorrhoids were  small. Complications:            No immediate complications. Estimated Blood Loss:     Estimated blood loss was minimal. Impression:               - One 7 mm polyp in the cecum, removed with a cold                            snare. Resected and retrieved.                           - One 5 mm polyp in the ascending colon, removed                            with a cold snare. Resected and retrieved.                           - One 5 mm polyp at the hepatic flexure.                           - One 7 mm polyp in the transverse colon, removed                            with a cold snare. Resected and retrieved.                           - One 6 mm polyp in the descending colon, removed                            with a cold snare. Resected and retrieved.                           - One 10 mm polyp in the sigmoid colon, removed                            with a hot snare. Resected and retrieved.                           - Mild diverticulosis from transverse colon to                            sigmoid colon.                           - Internal hemorrhoids. Recommendation:           - Patient has a contact number available for                            emergencies. The signs and symptoms of potential                            delayed complications were discussed with the  patient. Return to normal activities tomorrow.                            Written discharge instructions were provided to the                            patient.                           - Resume previous diet.                           - Continue  present medications.                           - Await pathology results.                           - Repeat colonoscopy is recommended for                            surveillance. The colonoscopy date will be                            determined after pathology results from today's                            exam become available for review.                           - No ibuprofen, naproxen, or other non-steroidal                            anti-inflammatory drugs for 2 weeks after polyp                            removal. Jerene Bears, MD 03/16/2018 10:03:33 AM This report has been signed electronically.

## 2018-03-17 ENCOUNTER — Telehealth: Payer: Self-pay

## 2018-03-17 ENCOUNTER — Telehealth: Payer: Self-pay | Admitting: *Deleted

## 2018-03-17 NOTE — Telephone Encounter (Signed)
  Follow up Call-  Call back number 03/16/2018  Post procedure Call Back phone  # [phone]  Permission to leave phone message Yes  Some recent data might be hidden     Patient questions:  Do you have a fever, pain , or abdominal swelling? No. Pain Score  0 *  Have you tolerated food without any problems? Yes.    Have you been able to return to your normal activities? Yes.    Do you have any questions about your discharge instructions: Diet   No. Medications  No. Follow up visit  No.  Do you have questions or concerns about your Care? No.  Actions: * If pain score is 4 or above: No action needed, pain <4.

## 2018-03-17 NOTE — Telephone Encounter (Signed)
  Follow up Call-  Call back number 03/16/2018  Post procedure Call Back phone  # [phone]  Permission to leave phone message Yes  Some recent data might be hidden     [facility]/Call-back [facility]

## 2018-03-20 ENCOUNTER — Telehealth: Payer: Self-pay | Admitting: Internal Medicine

## 2018-03-20 NOTE — Telephone Encounter (Signed)
Pt states he had diarrhea all night last night, his colonoscopy was done last week. Reports when he had the diarrhea stools he thought he also noted some burning with urination. Discussed with pt that there are stomach virus' going around and he may have a virus. Suggested he try imodium otc and to let us know if this continued. Also discussed with him that he should contact his PCP or urologist regarding burning with urination. Pt verbalized understanding.

## 2018-03-22 ENCOUNTER — Encounter: Payer: Self-pay | Admitting: Internal Medicine

## 2018-03-23 ENCOUNTER — Telehealth: Payer: Self-pay | Admitting: Internal Medicine

## 2018-03-23 NOTE — Telephone Encounter (Signed)
Copy of results letter sent to pt via mychart message.

## 2018-03-24 ENCOUNTER — Encounter: Payer: Self-pay | Admitting: Family Medicine

## 2018-03-24 DIAGNOSIS — Z86010 Personal history of colonic polyps: Secondary | ICD-10-CM | POA: Insufficient documentation

## 2018-03-24 DIAGNOSIS — H524 Presbyopia: Secondary | ICD-10-CM | POA: Diagnosis not present

## 2018-04-13 ENCOUNTER — Other Ambulatory Visit: Payer: Self-pay

## 2018-04-13 DIAGNOSIS — E785 Hyperlipidemia, unspecified: Secondary | ICD-10-CM

## 2018-04-13 MED ORDER — SIMVASTATIN 40 MG PO TABS: 40.0000 mg | ORAL_TABLET | Freq: Every evening | ORAL | 0 refills | Status: DC

## 2018-07-08 ENCOUNTER — Other Ambulatory Visit: Payer: Self-pay | Admitting: Family Medicine

## 2018-07-08 DIAGNOSIS — E785 Hyperlipidemia, unspecified: Secondary | ICD-10-CM

## 2018-07-12 ENCOUNTER — Other Ambulatory Visit: Payer: Self-pay | Admitting: Family Medicine

## 2018-08-23 ENCOUNTER — Encounter: Payer: Self-pay | Admitting: Family Medicine

## 2018-09-05 ENCOUNTER — Ambulatory Visit (INDEPENDENT_AMBULATORY_CARE_PROVIDER_SITE_OTHER): Payer: 59 | Admitting: Family Medicine

## 2018-09-05 ENCOUNTER — Encounter: Payer: Self-pay | Admitting: Family Medicine

## 2018-09-05 VITALS — BP 112/82 | HR 62 | Temp 97.7°F | Ht 76.0 in | Wt 258.4 lb

## 2018-09-05 DIAGNOSIS — E785 Hyperlipidemia, unspecified: Secondary | ICD-10-CM | POA: Diagnosis not present

## 2018-09-05 DIAGNOSIS — Z125 Encounter for screening for malignant neoplasm of prostate: Secondary | ICD-10-CM | POA: Diagnosis not present

## 2018-09-05 DIAGNOSIS — E038 Other specified hypothyroidism: Secondary | ICD-10-CM

## 2018-09-05 DIAGNOSIS — Z0000 Encounter for general adult medical examination without abnormal findings: Secondary | ICD-10-CM | POA: Diagnosis not present

## 2018-09-05 DIAGNOSIS — Z6831 Body mass index (BMI) 31.0-31.9, adult: Secondary | ICD-10-CM

## 2018-09-05 LAB — CBC
HEMATOCRIT: 44.5 % (ref 39.0–52.0)
Hemoglobin: 15.5 g/dL (ref 13.0–17.0)
MCHC: 34.8 g/dL (ref 30.0–36.0)
MCV: 88.8 fl (ref 78.0–100.0)
Platelets: 198 10*3/uL (ref 150.0–400.0)
RBC: 5.01 Mil/uL (ref 4.22–5.81)
RDW: 14 % (ref 11.5–15.5)
WBC: 4.6 10*3/uL (ref 4.0–10.5)

## 2018-09-05 LAB — LIPID PANEL
Cholesterol: 151 mg/dL (ref 0–200)
HDL: 42.7 mg/dL (ref >39.00)
LDL Cholesterol: 78 mg/dL (ref 0–99)
NONHDL: 108.39
Total CHOL/HDL Ratio: 4
Triglycerides: 151 mg/dL — ABNORMAL HIGH (ref 0.0–149.0)
VLDL: 30.2 mg/dL (ref 0.0–40.0)

## 2018-09-05 LAB — POC URINALSYSI DIPSTICK (AUTOMATED)
Bilirubin, UA: NEGATIVE
Blood, UA: NEGATIVE
Glucose, UA: NEGATIVE
Ketones, UA: NEGATIVE
LEUKOCYTES UA: NEGATIVE
NITRITE UA: NEGATIVE
PH UA: 6 (ref 5.0–8.0)
PROTEIN UA: NEGATIVE
Spec Grav, UA: 1.01 (ref 1.010–1.025)
UROBILINOGEN UA: 0.2 U/dL

## 2018-09-05 LAB — COMPREHENSIVE METABOLIC PANEL
ALK PHOS: 47 U/L (ref 39–117)
ALT: 18 U/L (ref 0–53)
AST: 18 U/L (ref 0–37)
Albumin: 4.7 g/dL (ref 3.5–5.2)
BUN: 14 mg/dL (ref 6–23)
CHLORIDE: 105 meq/L (ref 96–112)
CO2: 27 mEq/L (ref 19–32)
Calcium: 9.7 mg/dL (ref 8.4–10.5)
Creatinine, Ser: 0.98 mg/dL (ref 0.40–1.50)
GFR: 84.03 mL/min (ref >60.00)
GLUCOSE: 118 mg/dL — AB (ref 70–99)
POTASSIUM: 3.6 meq/L (ref 3.5–5.1)
Sodium: 138 mEq/L (ref 135–145)
TOTAL PROTEIN: 6.8 g/dL (ref 6.0–8.3)
Total Bilirubin: 0.7 mg/dL (ref 0.2–1.2)

## 2018-09-05 LAB — PSA: PSA: 0.42 ng/mL (ref 0.10–4.00)

## 2018-09-05 LAB — TSH: TSH: 1.33 u[IU]/mL (ref 0.35–4.50)

## 2018-09-05 MED ORDER — SILDENAFIL CITRATE 100 MG PO TABS: 50.0000 mg | ORAL_TABLET | Freq: Every day | ORAL | 11 refills | Status: DC | PRN

## 2018-09-05 NOTE — Patient Instructions (Addendum)
Declined flu shot  Let us know if you want to get Shingrix to prevent shingles  Great job on losing 11 lbs! Would love to see you keep up the good progress through next year  Please stop by lab before you go

## 2018-09-05 NOTE — Progress Notes (Signed)
Phone: [phone]  Subjective:  Patient presents today for their annual physical. Chief complaint-noted.   See problem oriented charting- ROS- full  review of systems was completed and negative except for: urinary frequency, penile pain, joint pain, nervous/anxious  The following were reviewed and entered/updated in epic: Past Medical History:  Diagnosis Date  . Anxiety    a lot of stressors. panic attacks in younger years.  . ED (erectile dysfunction)   . GERD (gastroesophageal reflux disease)    very mild  . Hyperlipidemia   . Thyroid disease   . Tobacco abuse    Patient Active Problem List   Diagnosis Date Noted  . History of adenomatous polyp of colon 03/24/2018    Priority: Medium  . Hypothyroidism 10/02/2007    Priority: Medium  . Hyperlipidemia 10/02/2007    Priority: Medium  . Former smoker 12/12/2012    Priority: Low  . ERECTILE DYSFUNCTION 10/03/2008    Priority: Low   Past Surgical History:  Procedure Laterality Date  . APPENDECTOMY  1998  . COLONOSCOPY    . HERNIA REPAIR  2003   right inguinal  . POLYPECTOMY      Family History  Problem Relation Age of Onset  . Hypertension Mother   . Hyperlipidemia Mother   . Colon polyps Mother   . Hypertension Father   . Hyperlipidemia Father   . Hyperlipidemia Sister   . Hyperlipidemia Brother   . Colon cancer Neg Hx   . Rectal cancer Neg Hx   . Stomach cancer Neg Hx   . Esophageal cancer Neg Hx     Medications- reviewed and updated Current Outpatient Medications  Medication Sig Dispense Refill  . aspirin EC 81 MG tablet Take 81 mg by mouth daily.    Marland Kitchen levothyroxine (SYNTHROID, LEVOTHROID) 125 MCG tablet TAKE 1 TABLET BY MOUTH EVERY DAY 90 tablet 0  . sildenafil (VIAGRA) 100 MG tablet Take 0.5-1 tablets (50-100 mg total) by mouth daily as needed for erectile dysfunction. 15 tablet 11  . simvastatin (ZOCOR) 40 MG tablet TAKE 1 TABLET BY MOUTH EVERY DAY IN THE EVENING 90 tablet 1   No current  facility-administered medications for this visit.     Allergies-reviewed and updated No Known Allergies  Social History   Social History Narrative   Divorced- lives with girlfriend. Son 74  and daughter 54 (in [place]) in 2018.       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 Oropharynx normal Neck: no thyromegaly CV: RRR no murmurs rubs or gallops Lungs: CTAB no crackles, wheeze, rhonchi Abdomen: soft/nontender/nondistended/normal bowel sounds. No rebound or guarding.  Ext: no edema Skin: warm, dry Neuro: grossly normal, moves all extremities, PERRLA  Defer rectal until next year  Assessment/Plan:  57 y.o. male presenting for annual physical.  Health Maintenance counseling: 1. Anticipatory guidance: Patient counseled regarding regular dental exams - q6 months, eye exams -yearly, wearing seatbelts.  2. Risk factor reduction:  Advised patient of need for regular exercise and diet rich and fruits and  vegetables to reduce risk of heart attack and stroke. Exercise- walking pretty regularly as weather allows- loves his hiking. Diet-set goal of 250 or less last visit down from 269- has tried to cut down on portion size. Discussed would love to keep pushing for 250.  Wt Readings from Last 3 Encounters:  09/05/18 258 lb 6.4 oz (117.2 kg)  03/16/18 261 lb (118.4 kg)  03/02/18 261 lb (118.4 kg)  3. Immunizations/screenings/ancillary studies- declines flu shot. Offered shingrix - opts out for now Immunization History  Administered Date(s) Administered  . Influenza Split 10/25/2011  . Td  11/16/2003  . Tdap 12/27/2013  4. Prostate cancer screening-  Update psa trend. He opts to skip rectal this year and repeat next year.  Nocturia 0-1x a night, some urinary frequency increase over years- does do fair amount of coffeee Lab Results  Component Value Date   PSA 0.40 02/10/2017   PSA 0.42 01/05/2016   PSA 0.36 12/31/2014   5. Colon cancer screening - 03-2018 with 3-year follow-up due to adenomatous colon polyp history 6. Skin cancer screening- no dermatologist. advised regular sunscreen use. Denies worrisome, changing, or new skin lesions.   Status of chronic or acute concerns    Mild hyperlipidemia last year even on simvastatin 40 mg-was to work on weight loss and has lost some. He is compliant with aspirin 81mg   -update lipids   Hypothyroidism- continue levothyroxine 125 mcg most likely.  Update TSH  Refill viagra- has been helpful  Former smoker-quit in 2004.  Very rare cigarette with stress-advised against last visit- he remains off cigarettes- daily vaping- advised to quit.   1-2x a year will feel some pain on penile tip at urethra  Mild occasional left knee pain. , gets some pain in fingers- sometimes feels like they can lock up/get more tense- often after sleeping  Some stress with son not doing everything that he should do/not always making good decisions. Nervousness related to these issues from ROS  Return in about 1 year (around 09/06/2019) for physical.  Lab/Order associations: NOT fasting Preventative health care - Plan: CBC, Comprehensive metabolic panel, Lipid panel, PSA, TSH, POCT Urinalysis Dipstick (Automated)  Other specified hypothyroidism - Plan: TSH  Hyperlipidemia, unspecified hyperlipidemia type - Plan: CBC, Comprehensive metabolic panel, Lipid panel, POCT Urinalysis Dipstick (Automated)  Screening for prostate cancer - Plan: PSA  BMI 31.0-31.9,adult  Meds ordered this encounter  Medications  . sildenafil (VIAGRA) 100 MG tablet    Sig:  Take 0.5-1 tablets (50-100 mg total) by mouth daily as needed for erectile dysfunction.    Dispense:  15 tablet    Refill:  11   Return precautions advised.  Garret Reddish, MD

## 2018-09-05 NOTE — Addendum Note (Signed)
Addended by: Kayren Eaves T on: 09/05/2018 01:44 PM   Modules accepted: Orders

## 2018-10-04 ENCOUNTER — Other Ambulatory Visit: Payer: Self-pay | Admitting: Family Medicine

## 2019-01-21 ENCOUNTER — Other Ambulatory Visit: Payer: Self-pay | Admitting: Family Medicine

## 2019-01-21 DIAGNOSIS — E785 Hyperlipidemia, unspecified: Secondary | ICD-10-CM

## 2019-04-04 DIAGNOSIS — H5213 Myopia, bilateral: Secondary | ICD-10-CM | POA: Diagnosis not present

## 2019-06-07 ENCOUNTER — Telehealth: Payer: Self-pay

## 2019-06-07 ENCOUNTER — Other Ambulatory Visit: Payer: Self-pay

## 2019-06-07 NOTE — Telephone Encounter (Signed)
Noted-appreciate your recommendations for him to be seen sooner

## 2019-06-07 NOTE — Telephone Encounter (Signed)
Patients having symptoms of tinging in both little fingers and some stiffness in started 3 weeks ago. Symptoms does not include pain or tingling down arm,no chest pain or soreness.patient declined appt,he scheduled for October,informed that he should be seen sooner,declined. Informed patient if any symptoms of an heart attack call EMS. Please advise

## 2019-06-07 NOTE — Telephone Encounter (Signed)
Patient left mychart message to scheduled appointment for October for Physical and he mentioned tingling in hands. Attempted to call patient to triage, no answer left voice message for patient to call clinic.

## 2019-06-07 NOTE — Telephone Encounter (Signed)
This encounter was created in error - please disregard.

## 2019-06-22 ENCOUNTER — Other Ambulatory Visit: Payer: Self-pay

## 2019-06-22 ENCOUNTER — Ambulatory Visit (INDEPENDENT_AMBULATORY_CARE_PROVIDER_SITE_OTHER): Payer: 59 | Admitting: Physician Assistant

## 2019-06-22 ENCOUNTER — Encounter: Payer: Self-pay | Admitting: Physician Assistant

## 2019-06-22 VITALS — BP 120/78 | HR 55 | Temp 97.8°F | Ht 76.0 in | Wt 250.2 lb

## 2019-06-22 DIAGNOSIS — R202 Paresthesia of skin: Secondary | ICD-10-CM | POA: Diagnosis not present

## 2019-06-22 DIAGNOSIS — R200 Anesthesia of skin: Secondary | ICD-10-CM

## 2019-06-22 DIAGNOSIS — R7309 Other abnormal glucose: Secondary | ICD-10-CM | POA: Diagnosis not present

## 2019-06-22 LAB — CBC
HCT: 45.1 % (ref 39.0–52.0)
Hemoglobin: 15.3 g/dL (ref 13.0–17.0)
MCHC: 33.9 g/dL (ref 30.0–36.0)
MCV: 90.9 fl (ref 78.0–100.0)
Platelets: 185 10*3/uL (ref 150.0–400.0)
RBC: 4.96 Mil/uL (ref 4.22–5.81)
RDW: 13.4 % (ref 11.5–15.5)
WBC: 4.9 10*3/uL (ref 4.0–10.5)

## 2019-06-22 LAB — BASIC METABOLIC PANEL
BUN: 14 mg/dL (ref 6–23)
CO2: 27 mEq/L (ref 19–32)
Calcium: 9.6 mg/dL (ref 8.4–10.5)
Chloride: 104 mEq/L (ref 96–112)
Creatinine, Ser: 0.97 mg/dL (ref 0.40–1.50)
GFR: 79.77 mL/min (ref >60.00)
Glucose, Bld: 76 mg/dL (ref 70–99)
Potassium: 4.4 mEq/L (ref 3.5–5.1)
Sodium: 139 mEq/L (ref 135–145)

## 2019-06-22 LAB — TSH: TSH: 1.99 u[IU]/mL (ref 0.35–4.50)

## 2019-06-22 LAB — VITAMIN B12: Vitamin B-12: 255 pg/mL (ref 211–911)

## 2019-06-22 LAB — HEMOGLOBIN A1C: Hgb A1c MFr Bld: 5.7 % (ref 4.6–6.5)

## 2019-06-22 NOTE — Patient Instructions (Signed)
It was great to see you!  HAPPY BIRTHDAY!!!!   We will contact you with your lab results and referral to sports medicine.  Take care,  Inda Coke PA-C

## 2019-06-22 NOTE — Progress Notes (Signed)
Cory Moore is a 58 y.o. male here for a new problem.  I acted as a Education administrator for [employer], PA-C Anselmo Pickler, LPN  History of Present Illness:   Chief Complaint  Patient presents with  . Tingling    Bilateral pinky fingers and bilateral feet    HPI   Tingling Pt c/o bilateral tingling pinky fingers and bilateral feet x 1 month. He has been kayaking weekly since this started. Does not seem to worsen at a particular time of day. 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 He tried a standing desk and didn't feel like it helped. Denies: swelling, erythema, pain. In regards to his feet, he has numbness at the balls of his feet that extend to his toes. He does admit that he mostly wears flip flops and also that when he sits at his desk, he typically sits with the balls of his feet against the ground and his heels up.  Denies hx of diabetes, does have recent elevated blood sugar on labs but he was not fasting at that time. Does have history of hypothyroidism but this has been stable for several years.     Past Medical History:  Diagnosis Date  . Anxiety    a lot of stressors. panic attacks in younger years.  . ED (erectile dysfunction)   . GERD (gastroesophageal reflux disease)    very mild  . Hyperlipidemia   . Thyroid disease   . Tobacco abuse      Social History   Socioeconomic History  . Marital status: Married    Spouse name: Not on file  . Number of children: Not on file  . Years of education: Not on file  . Highest education level: Not on file  Occupational History  . Not on file  Social Needs  . Financial resource strain: Not on file  . Food insecurity    Worry: Not on file    Inability: Not on file  . Transportation needs    Medical: Not on file    Non-medical: Not on file  Tobacco Use  . Smoking status: Current Every Day Smoker    Packs/day: 1.00    Years: 25.00    Pack years: 25.00    Types: E-cigarettes    Last  attempt to quit: 11/15/2002    Years since quitting: 16.6  . Smokeless tobacco: Never Used  Substance and Sexual Activity  . Alcohol use: Yes    Alcohol/week: 2.0 standard drinks    Types: 1 Glasses of wine, 1 Shots of liquor per week    Comment: socially - 1-2 a week   . Drug use: No  . Sexual activity: Not on file  Lifestyle  . Physical activity    Days per week: Not on file    Minutes per session: Not on file  . Stress: Not on file  Relationships  . Social Herbalist on phone: Not on file    Gets together: Not on file    Attends religious service: Not on file    Active member of club or organization: Not on file    Attends meetings of clubs or organizations: Not on file    Relationship status: Not on file  . Intimate partner violence    Fear of current or ex partner: Not on file    Emotionally abused: Not on file    Physically abused: Not on file  Forced sexual activity: Not on file  Other Topics Concern  . Not on file  Social History Narrative   Divorced- lives with girlfriend. Son 39  and daughter 87 (in [place]) in 2018.       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 APPENDECTOMY  1998  . COLONOSCOPY    . HERNIA REPAIR  2003   right inguinal  . POLYPECTOMY      Family History  Problem Relation Age of Onset  . Hypertension Mother   . Hyperlipidemia Mother   . Colon polyps Mother   . Hypertension Father   . Hyperlipidemia Father   . Hyperlipidemia Sister   . Hyperlipidemia Brother   . Colon cancer Neg Hx   . Rectal cancer Neg Hx   . Stomach cancer Neg Hx   . Esophageal cancer Neg Hx     No Known Allergies  Current Medications:   Current Outpatient Medications:  .  aspirin EC 81 MG tablet, Take 81 mg by mouth daily., Disp: , Rfl:  .  levothyroxine (SYNTHROID,  LEVOTHROID) 125 MCG tablet, TAKE 1 TABLET BY MOUTH EVERY DAY, Disp: 90 tablet, Rfl: 3 .  sildenafil (VIAGRA) 100 MG tablet, Take 0.5-1 tablets (50-100 mg total) by mouth daily as needed for erectile dysfunction., Disp: 15 tablet, Rfl: 11 .  simvastatin (ZOCOR) 40 MG tablet, TAKE 1 TABLET BY MOUTH EVERY DAY IN THE EVENING, Disp: 90 tablet, Rfl: 1   Review of Systems:   ROS Negative unless otherwise specified per HPI.  Vitals:   Vitals:   06/22/19 1120  BP: 120/78  Pulse: (!) 55  Temp: 97.8 F (36.6 C)  TempSrc: Temporal  SpO2: 97%  Weight: 250 lb 4 oz (113.5 kg)  Height: 6\' 4"  (1.93 m)     Body mass index is 30.46 kg/m.  Physical Exam:   Physical Exam Cardiovascular:     Pulses:          Dorsalis pedis pulses are 2+ on the right side and 2+ on the left side.       Posterior tibial pulses are 2+ on the right side and 2+ on the left side.     Comments: Cap refill 2 sec in bilateral fingers and toes Musculoskeletal:     Right lower leg: No edema.     Left lower leg: No edema.     Comments: Bilateral upper extremities: Normal ROM No abnormalities visualized  Feet:     Comments: Thickened calluses to balls of bilateral feet Monofilament testing normal on bilateral feet Neurological:     Comments: Grip strength 5/5 Decreased perceived sensation to bilateral posterior 5th finger     Assessment and Plan:   Cory Moore was seen today for tingling.  Diagnoses and all orders for this visit:  Numbness and tingling Unclear etiology. Exam without any red flags. Pinky issues may be due to some ulnar neuropathy from kayaking or poor neck posture. Unclear etiology of feet tingling/numbness, did recommend wearing regular shoes with good support as able. May need a stool to support feet on to better support his feet. Offered gabapentin but he declined. Refer to sports medicine for further evaluation. -     Vitamin B12 -     Hemoglobin A1c -     CBC -  TSH -     Basic metabolic  panel  Elevated glucose -     Hemoglobin A1c    . Reviewed expectations re: course of current medical issues. . Discussed self-management of symptoms. . Outlined signs and symptoms indicating need for more acute intervention. . Patient verbalized understanding and all questions were answered. . See orders for this visit as documented in the electronic medical record. . Patient received an After-Visit Summary.  CMA or LPN served as scribe during this visit. History, Physical, and Plan performed by medical provider. The above documentation has been reviewed and is accurate and complete.   Inda Coke, PA-C

## 2019-07-03 ENCOUNTER — Other Ambulatory Visit: Payer: Self-pay

## 2019-07-03 ENCOUNTER — Ambulatory Visit (INDEPENDENT_AMBULATORY_CARE_PROVIDER_SITE_OTHER): Payer: 59

## 2019-07-03 DIAGNOSIS — E538 Deficiency of other specified B group vitamins: Secondary | ICD-10-CM | POA: Diagnosis not present

## 2019-07-03 MED ORDER — CYANOCOBALAMIN 1000 MCG/ML IJ SOLN
1000.0000 ug | Freq: Once | INTRAMUSCULAR | Status: AC
  Administered 2019-07-03: 1000 ug via INTRAMUSCULAR

## 2019-07-03 NOTE — Progress Notes (Signed)
Per orders of Dr. Yong Channel, injection of Vitamin b12, 1000 mcg given left deltoid IM by Kevan Ny, CMA Patient tolerated injection well.  He will return in 1 week for his next injection.    Patient would like to continue getting his injections in office vs self-administering at home.

## 2019-07-10 ENCOUNTER — Other Ambulatory Visit: Payer: Self-pay | Admitting: Family Medicine

## 2019-07-10 ENCOUNTER — Ambulatory Visit (INDEPENDENT_AMBULATORY_CARE_PROVIDER_SITE_OTHER): Payer: 59

## 2019-07-10 ENCOUNTER — Other Ambulatory Visit: Payer: Self-pay

## 2019-07-10 DIAGNOSIS — E538 Deficiency of other specified B group vitamins: Secondary | ICD-10-CM

## 2019-07-10 DIAGNOSIS — E785 Hyperlipidemia, unspecified: Secondary | ICD-10-CM

## 2019-07-10 NOTE — Progress Notes (Signed)
Per orders of Dr. Yong Channel, injection of Vitamin b12, 1000 mcg given right deltoid IM by Kevan Ny, CMA Patient tolerated injection well.  He will return in 1 week for his next injection.

## 2019-07-11 DIAGNOSIS — E538 Deficiency of other specified B group vitamins: Secondary | ICD-10-CM | POA: Diagnosis not present

## 2019-07-11 MED ORDER — CYANOCOBALAMIN 1000 MCG/ML IJ SOLN
1000.0000 ug | Freq: Once | INTRAMUSCULAR | Status: AC
  Administered 2019-07-11: 1000 ug via INTRAMUSCULAR

## 2019-07-18 ENCOUNTER — Other Ambulatory Visit: Payer: Self-pay

## 2019-07-18 ENCOUNTER — Ambulatory Visit (INDEPENDENT_AMBULATORY_CARE_PROVIDER_SITE_OTHER): Payer: 59

## 2019-07-18 DIAGNOSIS — E538 Deficiency of other specified B group vitamins: Secondary | ICD-10-CM | POA: Diagnosis not present

## 2019-07-18 MED ORDER — CYANOCOBALAMIN 1000 MCG/ML IJ SOLN
1000.0000 ug | Freq: Once | INTRAMUSCULAR | Status: AC
  Administered 2019-07-18: 1000 ug via INTRAMUSCULAR

## 2019-07-18 NOTE — Progress Notes (Signed)
Per orders of Dr. Hunter, injection of vitamin B12 1000 mcg given in left deltoid by Yasin Ducat, CMA. Patient tolerated injection well.  

## 2019-07-24 ENCOUNTER — Ambulatory Visit (INDEPENDENT_AMBULATORY_CARE_PROVIDER_SITE_OTHER): Payer: 59

## 2019-07-24 ENCOUNTER — Other Ambulatory Visit: Payer: Self-pay

## 2019-07-24 DIAGNOSIS — E538 Deficiency of other specified B group vitamins: Secondary | ICD-10-CM

## 2019-07-24 MED ORDER — CYANOCOBALAMIN 1000 MCG/ML IJ SOLN
1000.0000 ug | Freq: Once | INTRAMUSCULAR | Status: AC
  Administered 2019-07-24: 1000 ug via INTRAMUSCULAR

## 2019-07-24 NOTE — Progress Notes (Signed)
Per orders of Inda Coke, PA-C, injection of Vitamin b12, 1000 mcg given right deltoid IM by Kevan Ny, CMA   Patient tolerated injection well.  He will return in 1 month for his next injection.

## 2019-07-30 ENCOUNTER — Ambulatory Visit (INDEPENDENT_AMBULATORY_CARE_PROVIDER_SITE_OTHER): Payer: 59 | Admitting: Family Medicine

## 2019-07-30 ENCOUNTER — Encounter: Payer: Self-pay | Admitting: Family Medicine

## 2019-07-30 ENCOUNTER — Other Ambulatory Visit: Payer: Self-pay

## 2019-07-30 DIAGNOSIS — R200 Anesthesia of skin: Secondary | ICD-10-CM | POA: Diagnosis not present

## 2019-07-30 MED ORDER — PREDNISONE 10 MG PO TABS: ORAL_TABLET | ORAL | 0 refills | Status: DC

## 2019-07-30 NOTE — Progress Notes (Signed)
Office Visit Note   Patient: Cory Moore           Date of Birth: August 14, 1961           MRN: [id number] Visit Date: 07/30/2019 Requested by: Inda Coke, [address],  [place] [address] PCP: Marin Olp, MD  Subjective: Chief Complaint  Patient presents with  . numbness/tingling bil hands x 6 weeks, now mainly left    HPI: He is here with left greater than right fifth finger numbness.  Symptoms started about a month ago, no injury.  He has been working at home due to COVID-19 and at first he wondered whether his workstation might not be ergonomically correct.  He has not noticed anything that seems to be triggering his symptoms.  He went to his PCP and had some labs showing no sign of diabetes, normal thyroid function and metabolic panel and blood count.  Slightly low vitamin B12 which has been treated with injections once weekly.  So far the B12 injections do not seem to be improving his hand numbness.  He has not noticed any weakness, he denies any neck pain, denies any headaches or vision change and denies any lower extremity symptoms.               ROS: Denies fevers or chills.  All other systems were reviewed and are negative.  Objective: Vital Signs: There were no vitals taken for this visit.  Physical Exam:  General:  Alert and oriented, in no acute distress. Pulm:  Breathing unlabored. Psy:  Normal mood, congruent affect. Skin: No visible rash. Neck: Full range of motion, negative Spurling's test.  Upper extremity strength and reflexes are normal. Arms: No subluxation of the ulnar nerve at the elbow on either side.  Slightly positive Tinel's at the ulnar groove on both sides.  Negative Tinel's at the carpal tunnel, negative Phalen's test.  Negative ulnar compression test at the elbow.   Imaging: None today.  Assessment & Plan: 1.  Bilateral fifth finger numbness, etiology uncertain but could be cubital tunnel syndrome -Discussed options  with him and elected to try a prednisone taper and referral to PT and Hand for modalities.  If symptoms do not improve after a few weeks, he will contact me for ordering of nerve conduction studies.     Procedures: No procedures performed  No notes on file     PMFS History: Patient Active Problem List   Diagnosis Date Noted  . History of adenomatous polyp of colon 03/24/2018  . Former smoker 12/12/2012  . ERECTILE DYSFUNCTION 10/03/2008  . Hypothyroidism 10/02/2007  . Hyperlipidemia 10/02/2007   Past Medical History:  Diagnosis Date  . Anxiety    a lot of stressors. panic attacks in younger years.  . ED (erectile dysfunction)   . GERD (gastroesophageal reflux disease)    very mild  . Hyperlipidemia   . Thyroid disease   . Tobacco abuse     Family History  Problem Relation Age of Onset  . Hypertension Mother   . Hyperlipidemia Mother   . Colon polyps Mother   . Hypertension Father   . Hyperlipidemia Father   . Hyperlipidemia Sister   . Hyperlipidemia Brother   . Colon cancer Neg Hx   . Rectal cancer Neg Hx   . Stomach cancer Neg Hx   . Esophageal cancer Neg Hx     Past Surgical History:  Procedure Laterality Date  . APPENDECTOMY  1998  .  COLONOSCOPY    . HERNIA REPAIR  2003   right inguinal  . POLYPECTOMY     Social History   Occupational History  . Not on file  Tobacco Use  . Smoking status: Current Every Day Smoker    Packs/day: 1.00    Years: 25.00    Pack years: 25.00    Types: E-cigarettes    Last attempt to quit: 11/15/2002    Years since quitting: 16.7  . Smokeless tobacco: Never Used  Substance and Sexual Activity  . Alcohol use: Yes    Alcohol/week: 2.0 standard drinks    Types: 1 Glasses of wine, 1 Shots of liquor per week    Comment: socially - 1-2 a week   . Drug use: No  . Sexual activity: Not on file

## 2019-08-21 ENCOUNTER — Ambulatory Visit (INDEPENDENT_AMBULATORY_CARE_PROVIDER_SITE_OTHER): Payer: 59

## 2019-08-21 ENCOUNTER — Other Ambulatory Visit: Payer: Self-pay

## 2019-08-21 DIAGNOSIS — E538 Deficiency of other specified B group vitamins: Secondary | ICD-10-CM | POA: Diagnosis not present

## 2019-08-21 MED ORDER — CYANOCOBALAMIN 1000 MCG/ML IJ SOLN
1000.0000 ug | Freq: Once | INTRAMUSCULAR | Status: AC
  Administered 2019-08-21: 1000 ug via INTRAMUSCULAR

## 2019-08-21 NOTE — Progress Notes (Signed)
.  Per orders of Dr. Yong Channel  injection of B12  Given in left deltoind  by Sandford Craze. Patient tolerated injection well.

## 2019-08-29 NOTE — Progress Notes (Signed)
I have reviewed and agree with note, evaluation, plan.   Stephen Hunter, MD  

## 2019-09-05 NOTE — Patient Instructions (Addendum)
Health Maintenance Due  Topic Date Due  . INFLUENZA VACCINE -today 06/16/2019   For low normal b12- we discussed when finishes shots to start cyanocobalamin/b12 1000 mcg and we can recheck next year.   Lets set a goal of 245-250 by next year (or lower if you desire)  Please stop by lab before you go If you do not have mychart- we will call you about results within 5 business days of [place] receiving them.  If you have mychart- we will send your results within 3 business days of [place] receiving them.  If abnormal or we want to clarify a result, we will call or mychart you to make sure you receive the message.  If you have questions or concerns or don't hear within 5-7 days, please send [place] a message or call us.

## 2019-09-05 NOTE — Progress Notes (Signed)
Phone: [phone]   Subjective:  Patient presents today for their annual physical. Chief complaint-noted.   See problem oriented charting- ROS- full  review of systems was completed and negative  except for: numbness in fingers, nervous/anxious  The following were reviewed and entered/updated in epic: Past Medical History:  Diagnosis Date  . Anxiety    a lot of stressors. panic attacks in younger years.  . ED (erectile dysfunction)   . GERD (gastroesophageal reflux disease)    very mild  . Hyperlipidemia   . Thyroid disease   . Tobacco abuse    Patient Active Problem List   Diagnosis Date Noted  . History of adenomatous polyp of colon 03/24/2018    Priority: Medium  . Hypothyroidism 10/02/2007    Priority: Medium  . Hyperlipidemia 10/02/2007    Priority: Medium  . Former smoker 12/12/2012    Priority: Low  . ERECTILE DYSFUNCTION 10/03/2008    Priority: Low   Past Surgical History:  Procedure Laterality Date  . APPENDECTOMY  1998  . COLONOSCOPY    . HERNIA REPAIR  2003   right inguinal  . POLYPECTOMY      Family History  Problem Relation Age of Onset  . Hypertension Mother   . Hyperlipidemia Mother   . Colon polyps Mother   . Hypertension Father   . Hyperlipidemia Father   . Hyperlipidemia Sister   . Hyperlipidemia Brother   . Colon cancer Neg Hx   . Rectal cancer Neg Hx   . Stomach cancer Neg Hx   . Esophageal cancer Neg Hx     Medications- reviewed and updated Current Outpatient Medications  Medication Sig Dispense Refill  . aspirin EC 81 MG tablet Take 81 mg by mouth daily.    Marland Kitchen levothyroxine (SYNTHROID, LEVOTHROID) 125 MCG tablet TAKE 1 TABLET BY MOUTH EVERY DAY 90 tablet 3  . sildenafil (VIAGRA) 100 MG tablet Take 0.5-1 tablets (50-100 mg total) by mouth daily as needed for erectile dysfunction. 15 tablet 11  . simvastatin (ZOCOR) 40 MG tablet TAKE 1 TABLET BY MOUTH EVERY DAY IN THE EVENING 90 tablet 0   No current facility-administered  medications for this visit.     Allergies-reviewed and updated No Known Allergies  Social History   Social History Narrative   Divorced- lives with girlfriend. Son 73  and daughter 24 (in [place]) in 2018.       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 TM normal. Bridge of nose normal. Eyelids normal.  Neck: no thyromegaly or cervical lymphadenopathy  CV: RRR no murmurs rubs or gallops Lungs: CTAB no crackles, wheeze, rhonchi Abdomen: soft/nontender/nondistended/normal bowel sounds. No rebound or guarding.  Ext: no edema Skin: warm, dry Neuro: grossly normal, moves all extremities, PERRLA    Assessment and Plan  58 y.o. male presenting for annual physical.  Health Maintenance counseling: 1. Anticipatory guidance: Patient counseled regarding regular dental exams -q6 months, eye exams -yearly,  avoiding smoking and second hand smoke- occasional e cigarette , limiting alcohol to 2 beverages per day .  2. Risk factor reduction:  Advised patient of need for regular exercise and diet rich and fruits and vegetables to reduce risk of heart attack and stroke. Exercise- still doing some hiking, somewhat less active due to covid 19 and working from home, wants to do this more consistently- getting walks in. Diet- still with weakness for sweets- discussed moderating- usually does light amount lunch and dinner.  Last physical patient was 89 and despite COVID-19 has lost 4 pounds-congratulated his efforts- has gained some from august.  Wt Readings from Last 3 Encounters:  09/10/19 254 lb 12.8 oz (115.6 kg)  06/22/19 250 lb 4 oz (113.5 kg)  09/05/18  258 lb 6.4 oz (117.2 kg)  3. Immunizations/screenings/ancillary studies-flu shot given today.  Also discussed Shingrix and he opts out for now- discuss next year  Immunization History  Administered Date(s) Administered  . Influenza Split 10/25/2011  . Td 11/16/2003  . Tdap 12/27/2013  4. Prostate cancer screening- low risk prior PSA trend-we will update with PSA today.  Will defer rectal unless concern PSA trend Lab Results  Component Value Date   PSA 0.42 09/05/2018   PSA 0.40 02/10/2017   PSA 0.42 01/05/2016   5. Colon cancer screening - History of adenomatous polyp of colon May 2019 with 3-year follow-up planned 6. Skin cancer screening- had skin tags removed 2018- has not had full skin exam. advised regular sunscreen use. Denies worrisome, changing, or new skin lesions.  7. Former smoker -quit in 2004.  25 pack years.  Not a candidate for lung cancer screening.  AAA scan discussed at age 45.  Will get UA at least every other year  - e cigs regularly- encouraged to quit- not ready at this time   Status of chronic or acute concerns  Hypothyroidism- controlled on levothyroxine 125mcg.  Update TSH today  Hyperlipidemia-reasonably controlled on Zocor 40mg .  We discussed new guidelines try to push LDL under 70- opted after discussion we will increase medicine of LDL over 100 Lab Results  Component Value Date   CHOL 151 09/05/2018   HDL 42.70 09/05/2018   LDLCALC 78 09/05/2018   TRIG 151.0 (H) 09/05/2018   CHOLHDL 4 09/05/2018    Numbness - primarily left hand scale of 1-10 about a 2. They think it is unlar neuropathy related- saw Dr. Junius Roads and has had 1 therapy session. Encouraged him to do exercise and follow up with Dr. Junius Roads if not improving -b12 was low normal and was treated with b12 injections weekly for 4 weeks then monthly x 4 months- we discussed when finishes shots to start cyanocobalamin/b12 1000 mcg and we can recheck next year.   Stress/anxiety- primarily son's situation  with substance abuse history- and trying to help aging parents.    Doing turmeric OTC  Recommended follow up: 1 year physical  Future Appointments  Date Time Provider [place]  09/21/2019  3:00 PM [facility] NURSE [facility]   Lab/Order associations: fasting No diagnosis found.  No orders of the defined types were placed in this encounter.   Return precautions advised.  Garret Reddish, MD

## 2019-09-10 ENCOUNTER — Encounter: Payer: Self-pay | Admitting: Family Medicine

## 2019-09-10 ENCOUNTER — Ambulatory Visit (INDEPENDENT_AMBULATORY_CARE_PROVIDER_SITE_OTHER): Payer: 59 | Admitting: Family Medicine

## 2019-09-10 ENCOUNTER — Other Ambulatory Visit: Payer: Self-pay

## 2019-09-10 VITALS — BP 120/82 | HR 59 | Temp 98.0°F | Ht 76.0 in | Wt 254.8 lb

## 2019-09-10 DIAGNOSIS — Z125 Encounter for screening for malignant neoplasm of prostate: Secondary | ICD-10-CM | POA: Diagnosis not present

## 2019-09-10 DIAGNOSIS — E785 Hyperlipidemia, unspecified: Secondary | ICD-10-CM

## 2019-09-10 DIAGNOSIS — Z0000 Encounter for general adult medical examination without abnormal findings: Secondary | ICD-10-CM | POA: Diagnosis not present

## 2019-09-10 DIAGNOSIS — Z86010 Personal history of colonic polyps: Secondary | ICD-10-CM | POA: Diagnosis not present

## 2019-09-10 DIAGNOSIS — Z87891 Personal history of nicotine dependence: Secondary | ICD-10-CM | POA: Diagnosis not present

## 2019-09-10 DIAGNOSIS — Z23 Encounter for immunization: Secondary | ICD-10-CM | POA: Diagnosis not present

## 2019-09-10 DIAGNOSIS — E038 Other specified hypothyroidism: Secondary | ICD-10-CM | POA: Diagnosis not present

## 2019-09-10 DIAGNOSIS — E538 Deficiency of other specified B group vitamins: Secondary | ICD-10-CM

## 2019-09-10 LAB — LIPID PANEL
Cholesterol: 176 mg/dL (ref 0–200)
HDL: 45.2 mg/dL (ref >39.00)
LDL Cholesterol: 99 mg/dL (ref 0–99)
NonHDL: 130.61
Total CHOL/HDL Ratio: 4
Triglycerides: 160 mg/dL — ABNORMAL HIGH (ref 0.0–149.0)
VLDL: 32 mg/dL (ref 0.0–40.0)

## 2019-09-10 LAB — PSA: PSA: 0.42 ng/mL (ref 0.10–4.00)

## 2019-09-10 LAB — CBC WITH DIFFERENTIAL/PLATELET
Basophils Absolute: 0.1 10*3/uL (ref 0.0–0.1)
Basophils Relative: 1.2 % (ref 0.0–3.0)
Eosinophils Absolute: 0 10*3/uL (ref 0.0–0.7)
Eosinophils Relative: 0.8 % (ref 0.0–5.0)
HCT: 47.1 % (ref 39.0–52.0)
Hemoglobin: 16.1 g/dL (ref 13.0–17.0)
Lymphocytes Relative: 43.2 % (ref 12.0–46.0)
Lymphs Abs: 2.1 10*3/uL (ref 0.7–4.0)
MCHC: 34.2 g/dL (ref 30.0–36.0)
MCV: 91.3 fl (ref 78.0–100.0)
Monocytes Absolute: 0.5 10*3/uL (ref 0.1–1.0)
Monocytes Relative: 9.8 % (ref 3.0–12.0)
Neutro Abs: 2.2 10*3/uL (ref 1.4–7.7)
Neutrophils Relative %: 45 % (ref 43.0–77.0)
Platelets: 231 10*3/uL (ref 150.0–400.0)
RBC: 5.16 Mil/uL (ref 4.22–5.81)
RDW: 13.3 % (ref 11.5–15.5)
WBC: 5 10*3/uL (ref 4.0–10.5)

## 2019-09-10 LAB — COMPREHENSIVE METABOLIC PANEL
ALT: 18 U/L (ref 0–53)
AST: 21 U/L (ref 0–37)
Albumin: 4.6 g/dL (ref 3.5–5.2)
Alkaline Phosphatase: 52 U/L (ref 39–117)
BUN: 12 mg/dL (ref 6–23)
CO2: 28 mEq/L (ref 19–32)
Calcium: 9.7 mg/dL (ref 8.4–10.5)
Chloride: 102 mEq/L (ref 96–112)
Creatinine, Ser: 1.01 mg/dL (ref 0.40–1.50)
GFR: 76.08 mL/min (ref >60.00)
Glucose, Bld: 90 mg/dL (ref 70–99)
Potassium: 4.6 mEq/L (ref 3.5–5.1)
Sodium: 138 mEq/L (ref 135–145)
Total Bilirubin: 0.8 mg/dL (ref 0.2–1.2)
Total Protein: 6.8 g/dL (ref 6.0–8.3)

## 2019-09-10 LAB — POC URINALSYSI DIPSTICK (AUTOMATED)
Bilirubin, UA: NEGATIVE
Blood, UA: NEGATIVE
Glucose, UA: NEGATIVE
Ketones, UA: NEGATIVE
Leukocytes, UA: NEGATIVE
Nitrite, UA: NEGATIVE
Protein, UA: NEGATIVE
Spec Grav, UA: 1.01 (ref 1.010–1.025)
Urobilinogen, UA: 0.2 E.U./dL
pH, UA: 7 (ref 5.0–8.0)

## 2019-09-10 LAB — TSH: TSH: 2.03 u[IU]/mL (ref 0.35–4.50)

## 2019-09-10 NOTE — Addendum Note (Signed)
Addended by: Francis Dowse T on: 09/10/2019 10:22 AM   Modules accepted: Orders

## 2019-09-21 ENCOUNTER — Other Ambulatory Visit: Payer: Self-pay

## 2019-09-21 ENCOUNTER — Telehealth: Payer: Self-pay

## 2019-09-21 ENCOUNTER — Ambulatory Visit (INDEPENDENT_AMBULATORY_CARE_PROVIDER_SITE_OTHER): Payer: 59

## 2019-09-21 DIAGNOSIS — E538 Deficiency of other specified B group vitamins: Secondary | ICD-10-CM

## 2019-09-21 MED ORDER — CYANOCOBALAMIN 1000 MCG/ML IJ SOLN
1000.0000 ug | Freq: Once | INTRAMUSCULAR | Status: AC
  Administered 2019-09-21: 1000 ug via INTRAMUSCULAR

## 2019-09-21 NOTE — Telephone Encounter (Signed)
error 

## 2019-09-21 NOTE — Progress Notes (Signed)
Per orders of Dr.Hunter injection of b12 given by Sandford Craze.in left deltoid Patient tolerated injection well.

## 2019-10-24 ENCOUNTER — Ambulatory Visit (INDEPENDENT_AMBULATORY_CARE_PROVIDER_SITE_OTHER): Payer: 59

## 2019-10-24 ENCOUNTER — Other Ambulatory Visit: Payer: Self-pay

## 2019-10-24 DIAGNOSIS — E538 Deficiency of other specified B group vitamins: Secondary | ICD-10-CM | POA: Diagnosis not present

## 2019-10-24 MED ORDER — CYANOCOBALAMIN 1000 MCG/ML IJ SOLN
1000.0000 ug | Freq: Once | INTRAMUSCULAR | Status: AC
  Administered 2019-10-24: 1000 ug via INTRAMUSCULAR

## 2019-10-24 NOTE — Progress Notes (Signed)
Per orders of Dr. Yong Channel, injection of B 12 given by Loralyn Freshwater.Given in Left Deltoid.Patient tolerated injection well.

## 2019-10-26 ENCOUNTER — Other Ambulatory Visit: Payer: Self-pay | Admitting: Family Medicine

## 2019-10-26 DIAGNOSIS — E785 Hyperlipidemia, unspecified: Secondary | ICD-10-CM

## 2020-01-21 ENCOUNTER — Other Ambulatory Visit: Payer: Self-pay | Admitting: Family Medicine

## 2020-01-21 DIAGNOSIS — E785 Hyperlipidemia, unspecified: Secondary | ICD-10-CM

## 2020-02-07 ENCOUNTER — Ambulatory Visit: Payer: 59 | Attending: Internal Medicine

## 2020-02-07 DIAGNOSIS — Z23 Encounter for immunization: Secondary | ICD-10-CM

## 2020-02-07 NOTE — Progress Notes (Signed)
   Covid-19 Vaccination Clinic  Name:  Cory Moore    MRN: [id number] DOB: 09-26-1961  02/07/2020  [name] was observed post Covid-19 immunization for 15 minutes without incident. He was provided with Vaccine Information Sheet and instruction to access the V-Safe system.   [name] was instructed to call 911 with any severe reactions post vaccine: Marland Kitchen Difficulty breathing  . Swelling of face and throat  . A fast heartbeat  . A bad rash all over body  . Dizziness and weakness   Immunizations Administered    Name Date Dose VIS Date Route   Pfizer COVID-19 Vaccine 02/07/2020 12:20 PM 0.3 mL 10/26/2019 Intramuscular   Manufacturer: [place]   Lot: [id number]   [place]: [id number]

## 2020-03-04 ENCOUNTER — Ambulatory Visit: Payer: 59 | Attending: Internal Medicine

## 2020-03-04 DIAGNOSIS — Z23 Encounter for immunization: Secondary | ICD-10-CM

## 2020-03-04 NOTE — Progress Notes (Signed)
   Covid-19 Vaccination Clinic  Name:  Cory Moore    MRN: [id number] DOB: 12/17/1960  03/04/2020  Mr. Cory Moore was observed post Covid-19 immunization for 15 minutes without incident. He was provided with Vaccine Information Sheet and instruction to access the V-Safe system.   Mr. Cory Moore was instructed to call 911 with any severe reactions post vaccine: Marland Kitchen Difficulty breathing  . Swelling of face and throat  . A fast heartbeat  . A bad rash all over body  . Dizziness and weakness   Immunizations Administered    Name Date Dose VIS Date Route   Pfizer COVID-19 Vaccine 03/04/2020  8:34 AM 0.3 mL 01/09/2019 Intramuscular   Manufacturer: [place]   Lot: [id number]   [place]: [id number]

## 2020-04-13 ENCOUNTER — Other Ambulatory Visit: Payer: Self-pay | Admitting: Family Medicine

## 2020-04-13 DIAGNOSIS — E785 Hyperlipidemia, unspecified: Secondary | ICD-10-CM

## 2020-07-10 ENCOUNTER — Other Ambulatory Visit: Payer: Self-pay | Admitting: Family Medicine

## 2020-07-10 DIAGNOSIS — E785 Hyperlipidemia, unspecified: Secondary | ICD-10-CM

## 2020-09-29 NOTE — Patient Instructions (Addendum)
Please stop by lab before you go If you have mychart- we will send your results within 3 business days of [place] receiving them.  If you do not have mychart- we will call you about results within 5 business days of [place] receiving them.  *please note we are currently using [facility] which has a longer processing time than [facility] typically so labs may not come back as quickly as in the past *please also note that you will see labs on mychart as soon as they post. I will later go in and write notes on them- will say "notes from Dr. Yong Channel"  Health Maintenance Due  Topic Date Due  . INFLUENZA VACCINE In office flu shot today.  06/15/2020   -Schedule your shingles shot in 6 months perhaps with our nurses- shingrix #1 then would need repeat in 2-6 months.   - increase voltaren to 4x a day for 7 days and if not better reasonable to follow up with orthopedics but I think this is just a slight arthritis flare  -lets set a goal of 5-10 lbs down in the next year at least. myfitnesspal can be helpful.   We will call you within two weeks about your referral for dermatology. If you do not hear within 3 weeks, give [place] a call.

## 2020-09-29 NOTE — Progress Notes (Signed)
Phone: [phone]    Subjective:  Patient presents today for their annual physical. Chief complaint-noted.   See problem oriented charting- ROS- full  review of systems was completed and negative  except for: right knee pain   The following were reviewed and entered/updated in epic: Past Medical History:  Diagnosis Date  . Anxiety    a lot of stressors. panic attacks in younger years.  . ED (erectile dysfunction)   . GERD (gastroesophageal reflux disease)    very mild  . Hyperlipidemia   . Thyroid disease   . Tobacco abuse    Patient Active Problem List   Diagnosis Date Noted  . History of adenomatous polyp of colon 03/24/2018    Priority: Medium  . Hypothyroidism 10/02/2007    Priority: Medium  . Hyperlipidemia 10/02/2007    Priority: Medium  . Former smoker 12/12/2012    Priority: Low  . ERECTILE DYSFUNCTION 10/03/2008    Priority: Low  . B12 deficiency 09/10/2019   Past Surgical History:  Procedure Laterality Date  . APPENDECTOMY  1998  . COLONOSCOPY    . HERNIA REPAIR  2003   right inguinal  . POLYPECTOMY      Family History  Problem Relation Age of Onset  . Hypertension Mother   . Hyperlipidemia Mother   . Colon polyps Mother   . Hypertension Father   . Hyperlipidemia Father   . Hyperlipidemia Sister   . Hyperlipidemia Brother   . Colon cancer Neg Hx   . Rectal cancer Neg Hx   . Stomach cancer Neg Hx   . Esophageal cancer Neg Hx     Medications- reviewed and updated Current Outpatient Medications  Medication Sig Dispense Refill  . aspirin EC 81 MG tablet Take 81 mg by mouth daily.    Marland Kitchen levothyroxine (SYNTHROID) 125 MCG tablet Take 1 tablet (125 mcg total) by mouth daily. 90 tablet 3  . sildenafil (VIAGRA) 100 MG tablet TAKE 1/2 TO 1 TABLET BY MOUTH DAILY AS NEEDED FOR ERECTILE DYSFUNCTION 6 tablet 29  . simvastatin (ZOCOR) 40 MG tablet TAKE 1 TABLET BY MOUTH EVERY DAY IN THE EVENING 90 tablet 3   No current facility-administered medications  for this visit.    Allergies-reviewed and updated No Known Allergies  Social History   Social History Narrative   Divorced- lives with girlfriend. Daughter data scientist in her 61s, son Rosana Hoes doing better recently- has had substance issues in past- 2020.       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 Oropharynx normal Neck: no thyromegaly CV: RRR no murmurs rubs or gallops Lungs: CTAB no crackles, wheeze, rhonchi Abdomen: soft/nontender/nondistended/normal bowel sounds. No rebound or guarding.  Ext: no edema Skin: warm, dry Neuro: grossly normal, moves all extremities, PERRLA     Assessment and Plan:  59 y.o. male presenting for annual physical.  Health Maintenance counseling: 1. Anticipatory guidance: Patient counseled regarding regular dental exams q6 months, eye exams- yearly,  avoiding smoking and second hand smoke- last year occasional e cigarette advised  against - he is still doing that and pretty regular- advised against again , limiting alcohol to 2 beverages per day.   2. Risk factor reduction:  Advised patient of need for regular exercise and diet rich and fruits and vegetables to reduce risk of heart attack and stroke. Exercise- last year was doing some hiking- still doing some on weekends- recommended 150 mins a week (has exercise bike). Diet- weakness for sweets- last year down 4 lbs from prior cpe despite covid 19. Feels like ordering out more- could do this less or pick healthier options. Discussed 5-10 lbs off goal in next year. g Wt Readings from Last 3 Encounters:  09/30/20 259 lb 6.4 oz (117.7 kg)  09/10/19 254  lb 12.8 oz (115.6 kg)  06/22/19 250 lb 4 oz (113.5 kg)  3. Immunizations/screenings/ancillary studies- flu shot today. Opts out of shingrix  For now- will schedule nurse visit Immunization History  Administered Date(s) Administered  . Influenza Split 10/25/2011  . Influenza,inj,Quad PF,6+ Mos 09/10/2019  . PFIZER SARS-COV-2 Vaccination 02/07/2020, 03/04/2020  . Td 11/16/2003  . Tdap 12/27/2013   4. Prostate cancer screening- trend PSA with labs today.  Defer rectal exam unless concerning PSA trend Lab Results  Component Value Date   PSA 0.42 09/10/2019   PSA 0.42 09/05/2018   PSA 0.40 02/10/2017   5. Colon cancer screening - history of adenomatous polyps of colon May 2019 with 3-year follow-up planned. 6. Skin cancer screening/prevention-skin tags removed in 2018 but has not recently had a full skin exam-offered dermatology referral. advised regular sunscreen use. Denies worrisome, changing, or new skin lesions.  7. STD screening- patient opts out as monogamous with girlfriend 41.  Former smoker-quit in 2004.  25 pack years.  Not a candidate for lung cancer screening considering how long ago he quit.  We will get urinalysis this year  Status of chronic or acute concerns   #Social update-still with some stress/anxiety- son with substance abuse history and caring for aging parents  # Right knee pain- started 2-3 days ago. In recent weeks/months hips seem to be sore laterally- better with activity but felt like had been sitting more and may have triggered it. Knee started aching after sleeping outside camping/hiking. Also seems kind of stiff (worse in AM and better with movement). voltaren gel 1-2x a day.  - increase voltaren to 4x a day for 7 days and if not better reasonable to follow up with orthopedics but I think this is just a slight arthritis flare  #hyperlipidemia S: Medication:simvastatin 40mg . He prefers to take aspirin 81mg - also has history of polyps and wants to stay on at least  thorugh 60 Lab Results  Component Value Date   CHOL 176 09/10/2019   HDL 45.20 09/10/2019   [place] 99 09/10/2019   TRIG 160.0 (H) 09/10/2019   CHOLHDL 4 09/10/2019   A/P: reasonable control with LDL under 100 last check- update lipids today  #hypothyroidism S: compliant On thyroid medication- Levothyroxine 125Mcg Lab Results  Component Value Date   TSH 2.03 09/10/2019   A/P:hopefully controlled- update TSH today   # B12 deficiency S: Current treatment/medication (oral vs. IM): cyanocobalamin 2000 mcg daily  Lab Results  Component Value Date   VITAMINB12 255 06/22/2019  A/P: hopefully controlled- update b12 levels -continue current meds.    #Numbness/B12 deficiency -ongoing issue with left hand in past- better recently.  Thought to be ulnar neuropathy related. Had some prior foot tingling that is better.  Has seen Dr.  Hilts in the past.  B12 was low normal and had prior treatment he is taking oral B12 now-update B12 level as above   Recommended follow up: Return in about 1 year (around 09/30/2021) for physical or sooner if needed.  Lab/Order associations: fasting (black coffee)   ICD-10-CM   1. Preventative health care  R83.09 COMPLETE METABOLIC PANEL WITH GFR    PSA    TSH    CBC with Differential/Platelet    Lipid Panel w/reflex Direct LDL    Vitamin B12    POCT Urinalysis Dipstick (Automated)  2. Other specified hypothyroidism  E03.8 TSH  3. B12 deficiency  E53.8 Vitamin B12  4. Hyperlipidemia, unspecified hyperlipidemia type  M07.6 COMPLETE METABOLIC PANEL WITH GFR    CBC with Differential/Platelet    Lipid Panel w/reflex Direct LDL  5. Screening for prostate cancer  Z12.5 PSA  6. Former smoker  Z87.891 POCT Urinalysis Dipstick (Automated)  7. Screening exam for skin cancer  Z12.83 Ambulatory referral to Dermatology  8. Hyperlipidemia  E78.5 simvastatin (ZOCOR) 40 MG tablet    Meds ordered this encounter  Medications  . levothyroxine (SYNTHROID) 125 MCG tablet     Sig: Take 1 tablet (125 mcg total) by mouth daily.    Dispense:  90 tablet    Refill:  3  . simvastatin (ZOCOR) 40 MG tablet    Sig: TAKE 1 TABLET BY MOUTH EVERY DAY IN THE EVENING    Dispense:  90 tablet    Refill:  3    Return precautions advised.   Garret Reddish, MD

## 2020-09-30 ENCOUNTER — Encounter: Payer: Self-pay | Admitting: Family Medicine

## 2020-09-30 ENCOUNTER — Ambulatory Visit (INDEPENDENT_AMBULATORY_CARE_PROVIDER_SITE_OTHER): Payer: 59 | Admitting: Family Medicine

## 2020-09-30 ENCOUNTER — Other Ambulatory Visit: Payer: Self-pay

## 2020-09-30 VITALS — BP 118/70 | HR 64 | Temp 97.2°F | Resp 18 | Ht 76.0 in | Wt 259.4 lb

## 2020-09-30 DIAGNOSIS — E538 Deficiency of other specified B group vitamins: Secondary | ICD-10-CM

## 2020-09-30 DIAGNOSIS — Z0000 Encounter for general adult medical examination without abnormal findings: Secondary | ICD-10-CM

## 2020-09-30 DIAGNOSIS — E038 Other specified hypothyroidism: Secondary | ICD-10-CM

## 2020-09-30 DIAGNOSIS — Z23 Encounter for immunization: Secondary | ICD-10-CM

## 2020-09-30 DIAGNOSIS — Z87891 Personal history of nicotine dependence: Secondary | ICD-10-CM | POA: Diagnosis not present

## 2020-09-30 DIAGNOSIS — E785 Hyperlipidemia, unspecified: Secondary | ICD-10-CM

## 2020-09-30 DIAGNOSIS — Z125 Encounter for screening for malignant neoplasm of prostate: Secondary | ICD-10-CM

## 2020-09-30 DIAGNOSIS — Z1283 Encounter for screening for malignant neoplasm of skin: Secondary | ICD-10-CM

## 2020-09-30 LAB — POC URINALSYSI DIPSTICK (AUTOMATED)
Bilirubin, UA: NEGATIVE
Blood, UA: NEGATIVE
Glucose, UA: NEGATIVE
Ketones, UA: NEGATIVE
Leukocytes, UA: NEGATIVE
Nitrite, UA: NEGATIVE
Protein, UA: NEGATIVE
Spec Grav, UA: 1.015 (ref 1.010–1.025)
Urobilinogen, UA: 0.2 E.U./dL
pH, UA: 7 (ref 5.0–8.0)

## 2020-09-30 MED ORDER — SIMVASTATIN 40 MG PO TABS: ORAL_TABLET | ORAL | 3 refills | Status: DC

## 2020-09-30 MED ORDER — LEVOTHYROXINE SODIUM 125 MCG PO TABS
125.0000 ug | ORAL_TABLET | Freq: Every day | ORAL | 3 refills | Status: DC
Start: 2020-09-30 — End: 2021-09-24

## 2020-09-30 NOTE — Addendum Note (Signed)
Addended by: Thomes Cake on: 09/30/2020 11:03 AM   Modules accepted: Orders

## 2020-10-01 LAB — COMPLETE METABOLIC PANEL WITH GFR
AG Ratio: 2 (calc) (ref 1.0–2.5)
ALT: 17 U/L (ref 9–46)
AST: 20 U/L (ref 10–35)
Albumin: 4.3 g/dL (ref 3.6–5.1)
Alkaline phosphatase (APISO): 50 U/L (ref 35–144)
BUN: 13 mg/dL (ref 7–25)
CO2: 27 mmol/L (ref 20–32)
Calcium: 9.6 mg/dL (ref 8.6–10.3)
Chloride: 103 mmol/L (ref 98–110)
Creat: 1.11 mg/dL (ref 0.70–1.33)
GFR, Est African American: 84 mL/min/{1.73_m2} (ref >=60)
GFR, Est Non African American: 73 mL/min/{1.73_m2} (ref >=60)
Globulin: 2.2 g/dL (calc) (ref 1.9–3.7)
Glucose, Bld: 102 mg/dL — ABNORMAL HIGH (ref 65–99)
Potassium: 5 mmol/L (ref 3.5–5.3)
Sodium: 139 mmol/L (ref 135–146)
Total Bilirubin: 0.5 mg/dL (ref 0.2–1.2)
Total Protein: 6.5 g/dL (ref 6.1–8.1)

## 2020-10-01 LAB — LIPID PANEL W/REFLEX DIRECT LDL
Cholesterol: 179 mg/dL (ref <200)
HDL: 53 mg/dL (ref >=40)
LDL Cholesterol (Calc): 108 mg/dL (calc) — ABNORMAL HIGH
Non-HDL Cholesterol (Calc): 126 mg/dL (calc) (ref <130)
Total CHOL/HDL Ratio: 3.4 (calc) (ref <5.0)
Triglycerides: 90 mg/dL (ref <150)

## 2020-10-01 LAB — CBC WITH DIFFERENTIAL/PLATELET
Absolute Monocytes: 431 cells/uL (ref 200–950)
Basophils Absolute: 29 cells/uL (ref 0–200)
Basophils Relative: 0.6 %
Eosinophils Absolute: 0 cells/uL — ABNORMAL LOW (ref 15–500)
Eosinophils Relative: 0 %
HCT: 43.3 % (ref 38.5–50.0)
Hemoglobin: 15.2 g/dL (ref 13.2–17.1)
Lymphs Abs: 1896 cells/uL (ref 850–3900)
MCH: 31.8 pg (ref 27.0–33.0)
MCHC: 35.1 g/dL (ref 32.0–36.0)
MCV: 90.6 fL (ref 80.0–100.0)
MPV: 10.9 fL (ref 7.5–12.5)
Monocytes Relative: 8.8 %
Neutro Abs: 2543 cells/uL (ref 1500–7800)
Neutrophils Relative %: 51.9 %
Platelets: 212 10*3/uL (ref 140–400)
RBC: 4.78 10*6/uL (ref 4.20–5.80)
RDW: 12.7 % (ref 11.0–15.0)
Total Lymphocyte: 38.7 %
WBC: 4.9 10*3/uL (ref 3.8–10.8)

## 2020-10-01 LAB — TSH: TSH: 4.61 mIU/L — ABNORMAL HIGH (ref 0.40–4.50)

## 2020-10-01 LAB — VITAMIN B12: Vitamin B-12: 1258 pg/mL — ABNORMAL HIGH (ref 200–1100)

## 2020-10-01 LAB — PSA: PSA: 0.35 ng/mL (ref <=4.0)

## 2020-10-17 ENCOUNTER — Ambulatory Visit: Payer: 59 | Attending: Internal Medicine

## 2020-10-17 DIAGNOSIS — Z23 Encounter for immunization: Secondary | ICD-10-CM

## 2020-10-17 NOTE — Progress Notes (Signed)
   Covid-19 Vaccination Clinic  Name:  Cory Moore    MRN: [id number] DOB: 07-24-1961  10/17/2020  Mr. Blankenbaker was observed post Covid-19 immunization for 15 minutes without incident. He was provided with Vaccine Information Sheet and instruction to access the V-Safe system.   Mr. Pat was instructed to call 911 with any severe reactions post vaccine: Marland Kitchen Difficulty breathing  . Swelling of face and throat  . A fast heartbeat  . A bad rash all over body  . Dizziness and weakness   Immunizations Administered    Name Date Dose VIS Date Route   Pfizer COVID-19 Vaccine 10/17/2020  3:57 PM 0.3 mL 09/03/2020 Intramuscular   Manufacturer: [place]   Lot: [id number]   NDC: [address]-8

## 2020-12-10 ENCOUNTER — Other Ambulatory Visit: Payer: Self-pay | Admitting: Family Medicine

## 2020-12-25 ENCOUNTER — Other Ambulatory Visit: Payer: Self-pay

## 2020-12-25 ENCOUNTER — Telehealth: Payer: Self-pay

## 2020-12-25 DIAGNOSIS — M25559 Pain in unspecified hip: Secondary | ICD-10-CM

## 2020-12-25 NOTE — Telephone Encounter (Signed)
Patient is calling in stating he talked with Dr.Hunter back in Novmeber about his hip issues, asking for a referral to an Orthopedic as his issues have not gotten any better.

## 2020-12-25 NOTE — Telephone Encounter (Signed)
Referral to Ortho has been placed.  

## 2020-12-31 ENCOUNTER — Ambulatory Visit: Payer: Self-pay

## 2020-12-31 ENCOUNTER — Ambulatory Visit (INDEPENDENT_AMBULATORY_CARE_PROVIDER_SITE_OTHER): Payer: 59 | Admitting: Family Medicine

## 2020-12-31 ENCOUNTER — Other Ambulatory Visit: Payer: Self-pay

## 2020-12-31 DIAGNOSIS — M5441 Lumbago with sciatica, right side: Secondary | ICD-10-CM | POA: Diagnosis not present

## 2020-12-31 DIAGNOSIS — M5442 Lumbago with sciatica, left side: Secondary | ICD-10-CM

## 2020-12-31 MED ORDER — BACLOFEN 10 MG PO TABS: 5.0000 mg | ORAL_TABLET | Freq: Three times a day (TID) | ORAL | 3 refills | Status: DC | PRN

## 2020-12-31 MED ORDER — PREDNISONE 10 MG PO TABS: ORAL_TABLET | ORAL | 0 refills | Status: DC

## 2020-12-31 NOTE — Progress Notes (Signed)
I saw and examined the patient with Dr. Elouise Munroe and agree with assessment and plan as outlined.    He has been having left lower back and posterior hip pain for the past 2 weeks, no injury.  No groin pain.  Pain does not radiate all the way down the leg.  He has been using ibuprofen twice daily.  He has had some back issues in the past but never this persistent.  Exam reveals tenderness near the left SI joint and in the sciatic notch..  Lower extremity strength and reflexes are normal.  X-rays reveal moderate L5-S1 degenerative disc disease.  We will treat with home exercises, prednisone and baclofen.  If symptoms persist, physical therapy or chiropractic.  If still no relief then MRI scan.

## 2020-12-31 NOTE — Progress Notes (Signed)
Office Visit Note   Patient: Cory Moore           Date of Birth: 1961/02/18           MRN: [id number] Visit Date: 12/31/2020 Requested by: Marin Olp, MD [place],  [place] [address] PCP: Marin Olp, MD  Subjective: Chief Complaint  Patient presents with  . Lower Back - Pain    Intermittent pain for a few months, but has been "relentless" over the past 2 weeks. Pain across his back and into the hips and anterior thighs at times. Today the pain is more in the left lateral hip area.    HPI: 60yo M presenting to clinic with several months of lower back an hip pain. Patient denies any trauma, stating that his pain has gradually worsened over the past few months. Today he says he is feeling quite well, with only pain along the left lateral hip, though most often pain stretches across his lower back, into both hips, and down his anterior thighs. He feels like it is primarily focused on the left aspect of his lower back when it is bad, and is often worsened by any bending over. He confesses to prolonged sitting at work throughout the day, though tries to stay active with hiking on weekends. No numbness or tingling. No weakness in the leg. He takes Ibuprofen 400mg  BID since his pain started, as well as voltaren gel, which he says helps only minimally.               ROS:   All other systems were reviewed and are negative.  Objective: Vital Signs: There were no vitals taken for this visit.  Physical Exam:  General:  Alert and oriented, in no acute distress. Pulm:  Breathing unlabored. Psy:  Normal mood, congruent affect. Skin:  No bruising, no rashes.   BACK EXAMINATION: Normal Gait.  Normal Spinal curvature, without excessive lumbar lordosis, thoracic kyphosis, or scoliosis.  ROM: Endorses pain with both forward flexion and extension, unable to tolerate much extension beyond 5*, and unable to reach past knees in flexion. No pain with Rotation or Side-bending.   Bilateral hips with full ROM, symmetrical internal and external rotation.  Palpation: Endorses left sided paraspinal muscle tenderness at lower lumbar region. Some left SI Joint tenderness, though spring's test negative. No midline tenderness, no deformity or step-offs. Gluteus musculature and piriformis without tender points.  No significant tenderness over bilateral greater trochanters.   Strength: Hip flexion (L1), Hip Aduction (L2), Knee Extension (L3) are 5/5 Bilaterally Foot Inversion (L4), Dorsiflexion (L5), and Eversion (S1) 5/5 Bilaterally Sensation: Intact to light touch medial and lateral aspects of lower extremities, and lateral, dorsal, and medial aspects of foot.   Special Tests:  FABER CausesNo SI Joint Pain FADIR Negative SLR: No radiation down Ipilateral or contralateral leg bilaterally   Imaging: Lumbar Xray, 2 view Significant disc heigh loss at L5-S1, with associated facet sclerosis.   Assessment & Plan: 60yo M presenting to clinic with concerns of left-sided lower back pain, radiating into his hips and anterior thigh. Examination and Xrays today concerning for possible L5 pathology as a primary source of his symptoms, though he has reassuringly no weakness.  - Will give short course of prednisone to calm down acute pain - HEP for lumbar stabilization exercises - Could consider PT vs advanced imaging if not improving.  - Patient expresses understanding with plan. He has no further questions or concerns today.  Procedures: No procedures performed        PMFS History: Patient Active Problem List   Diagnosis Date Noted  . B12 deficiency 09/10/2019  . History of adenomatous polyp of colon 03/24/2018  . Former smoker 12/12/2012  . ERECTILE DYSFUNCTION 10/03/2008  . Hypothyroidism 10/02/2007  . Hyperlipidemia 10/02/2007   Past Medical History:  Diagnosis Date  . Anxiety    a lot of stressors. panic attacks in younger years.  . ED (erectile  dysfunction)   . GERD (gastroesophageal reflux disease)    very mild  . Hyperlipidemia   . Thyroid disease   . Tobacco abuse     Family History  Problem Relation Age of Onset  . Hypertension Mother   . Hyperlipidemia Mother   . Colon polyps Mother   . Hypertension Father   . Hyperlipidemia Father   . Hyperlipidemia Sister   . Hyperlipidemia Brother   . Colon cancer Neg Hx   . Rectal cancer Neg Hx   . Stomach cancer Neg Hx   . Esophageal cancer Neg Hx     Past Surgical History:  Procedure Laterality Date  . APPENDECTOMY  1998  . COLONOSCOPY    . HERNIA REPAIR  2003   right inguinal  . POLYPECTOMY     Social History   Occupational History  . Not on file  Tobacco Use  . Smoking status: Former Smoker    Packs/day: 1.00    Years: 25.00    Pack years: 25.00    Types: E-cigarettes    Quit date: 11/15/2002    Years since quitting: 18.1  . Smokeless tobacco: Never Used  . Tobacco comment: but still on e cigs daily  Substance and Sexual Activity  . Alcohol use: Yes    Alcohol/week: 2.0 standard drinks    Types: 1 Glasses of wine, 1 Shots of liquor per week    Comment: socially - 1-2 a week   . Drug use: No  . Sexual activity: Not on file

## 2021-01-03 ENCOUNTER — Emergency Department (HOSPITAL_COMMUNITY)
Admission: EM | Admit: 2021-01-03 | Discharge: 2021-01-03 | Disposition: A | Discharge status: Home / Self Care | Payer: 59 | Attending: Emergency Medicine | Admitting: Emergency Medicine

## 2021-01-03 ENCOUNTER — Emergency Department (HOSPITAL_COMMUNITY): Payer: 59

## 2021-01-03 ENCOUNTER — Other Ambulatory Visit: Payer: Self-pay

## 2021-01-03 ENCOUNTER — Encounter (HOSPITAL_COMMUNITY): Payer: Self-pay | Admitting: Emergency Medicine

## 2021-01-03 DIAGNOSIS — M5126 Other intervertebral disc displacement, lumbar region: Secondary | ICD-10-CM | POA: Diagnosis not present

## 2021-01-03 DIAGNOSIS — Z79899 Other long term (current) drug therapy: Secondary | ICD-10-CM | POA: Insufficient documentation

## 2021-01-03 DIAGNOSIS — R202 Paresthesia of skin: Secondary | ICD-10-CM | POA: Diagnosis not present

## 2021-01-03 DIAGNOSIS — R29810 Facial weakness: Secondary | ICD-10-CM | POA: Diagnosis not present

## 2021-01-03 DIAGNOSIS — M545 Low back pain, unspecified: Secondary | ICD-10-CM | POA: Diagnosis present

## 2021-01-03 DIAGNOSIS — Z87891 Personal history of nicotine dependence: Secondary | ICD-10-CM | POA: Insufficient documentation

## 2021-01-03 DIAGNOSIS — E039 Hypothyroidism, unspecified: Secondary | ICD-10-CM | POA: Diagnosis not present

## 2021-01-03 IMAGING — MR MR THORACIC SPINE W/O CM
8 of 11 series · 35 of 48 positions shown · non-contrast
Comparison: None.

CLINICAL DATA: Onset left foot drop last night. Left leg tingling
for several days.

EXAM:
MRI THORACIC AND LUMBAR SPINE WITHOUT CONTRAST
TECHNIQUE: Multiplanar and multiecho pulse sequences of the thoracic and lumbar
spine were obtained without intravenous contrast.

[Series 16: T1 · sagittal · 4.0mm · 1.72mm/px · 1 of 5 slices shown (1 of 4)]
[im 1/5]
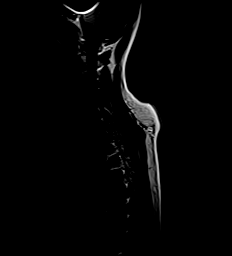

[Series 17: T2 · sagittal · 3.0mm · 0.91mm/px · 2 of 16 slices shown (1 of 4)]
[im 1/16]
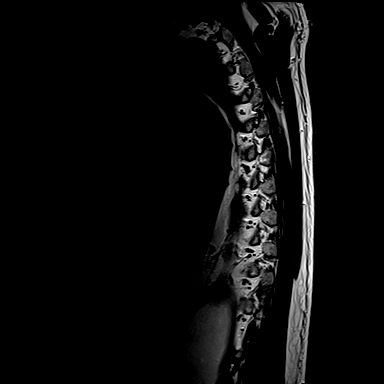
[im 16/16]
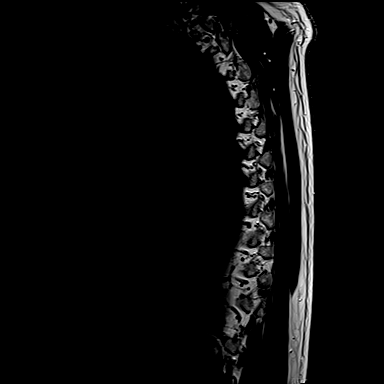

[Series 19: T1 · sagittal · 3.0mm · 1.09mm/px · 3 of 15 slices shown (2 of 4)]
[im 1/15]
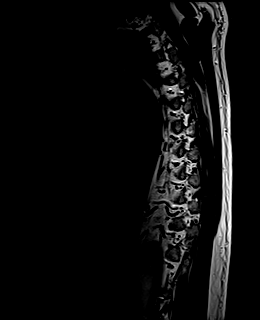
[im 8/15]
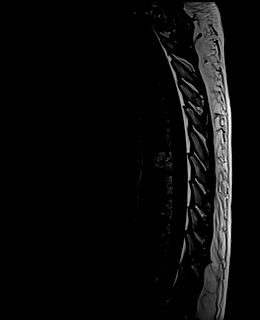
[im 15/15]
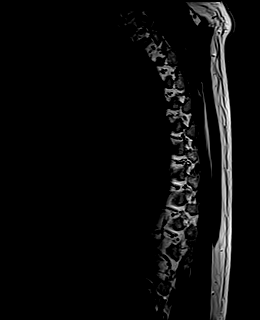

[Series 20: T2 · axial · 4.0mm · 0.78mm/px · z∈[-340,-102]mm · 7 of 42 slices shown (2 of 4)]
[im 1/42]
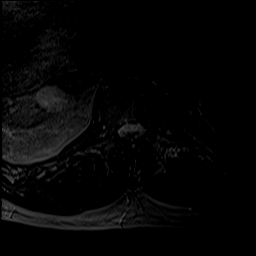
[im 7/42]
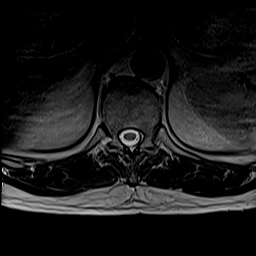
[im 14/42]
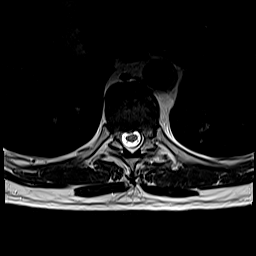
[im 21/42]
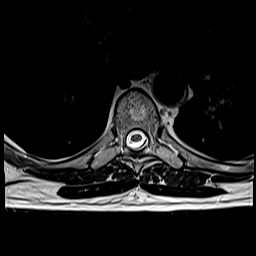
[im 28/42]
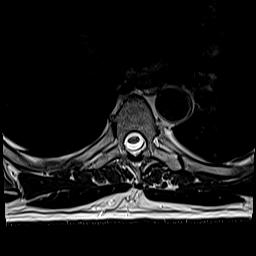
[im 35/42]
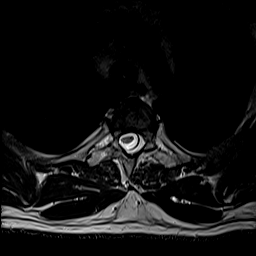
[im 42/42]
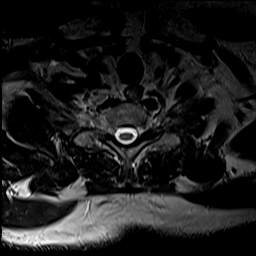

[Series 22: T1 · sagittal · 4.0mm · 0.81mm/px · 3 of 17 slices shown (3 of 4)]
[im 1/17]
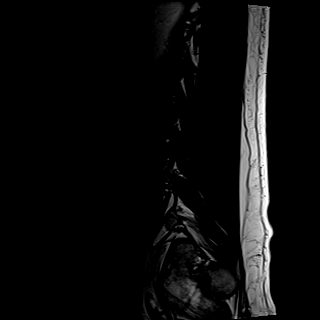
[im 9/17]
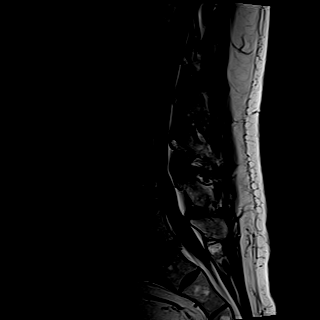
[im 17/17]
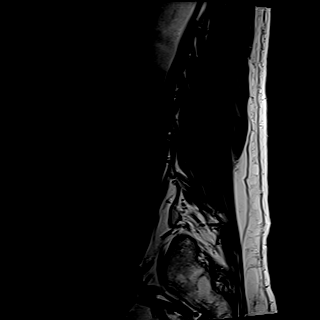

[Series 23: T2 · sagittal · 4.0mm · 0.81mm/px · 3 of 17 slices shown (3 of 4)]
[im 1/17]
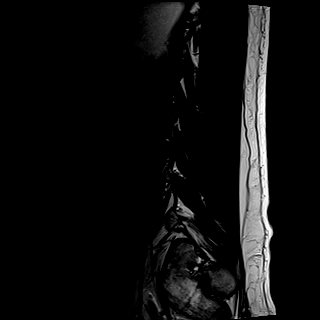
[im 9/17]
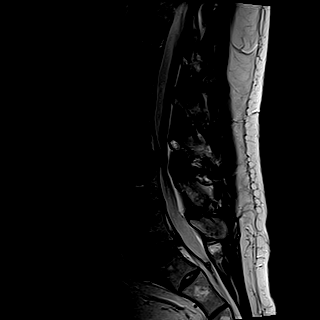
[im 17/17]
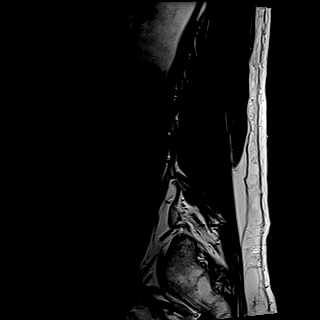

[Series 25: T2 · axial · 4.0mm · 0.62mm/px · z∈[-580,-344]mm · 8 of 43 slices shown (4 of 4)]
[im 1/43]
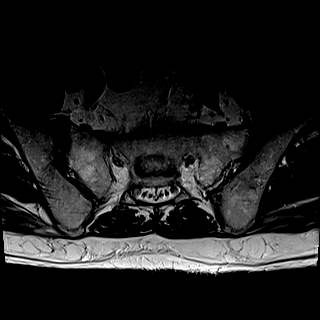
[im 7/43]
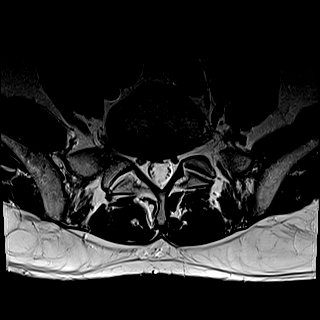
[im 13/43]
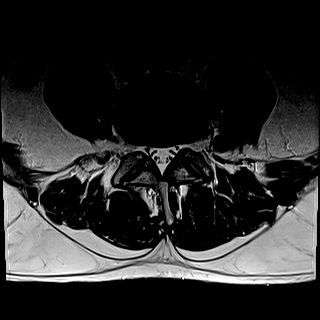
[im 19/43]
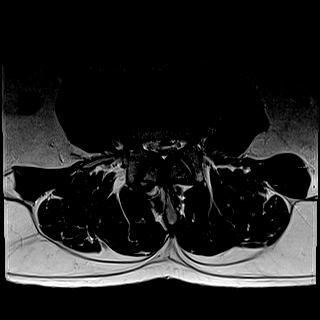
[im 25/43]
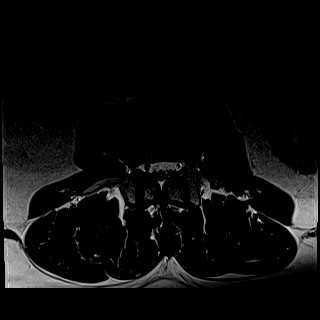
[im 31/43]
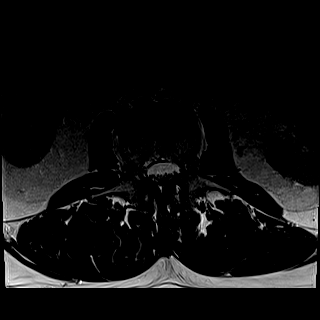
[im 37/43]
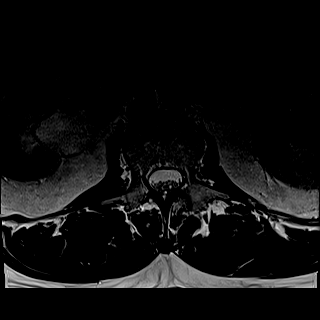
[im 43/43]
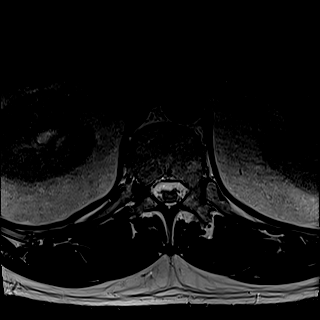

[Series 26: T1 · axial · 4.0mm · 0.39mm/px · z∈[-580,-344]mm · 8 of 43 slices shown (4 of 4)]
[im 1/43]
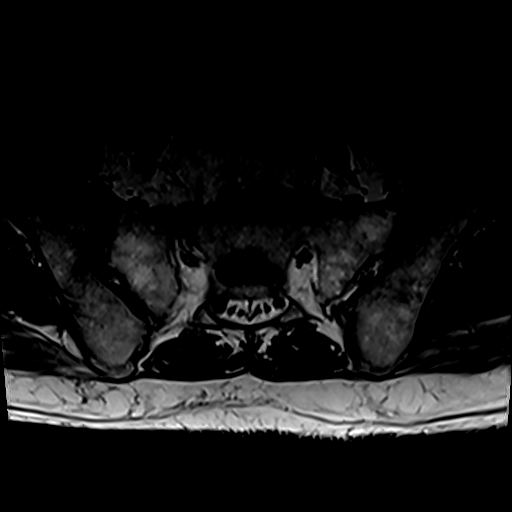
[im 7/43]
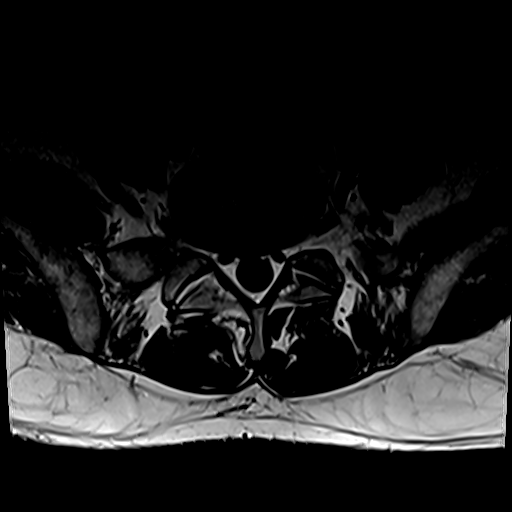
[im 13/43]
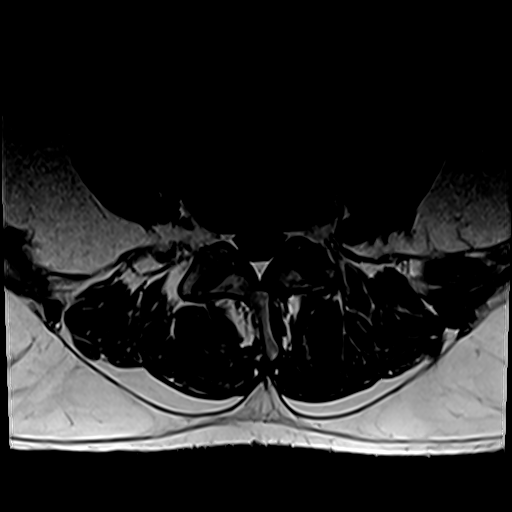
[im 19/43]
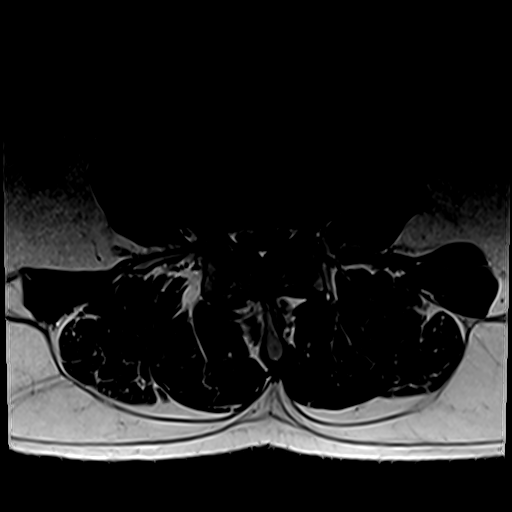
[im 25/43]
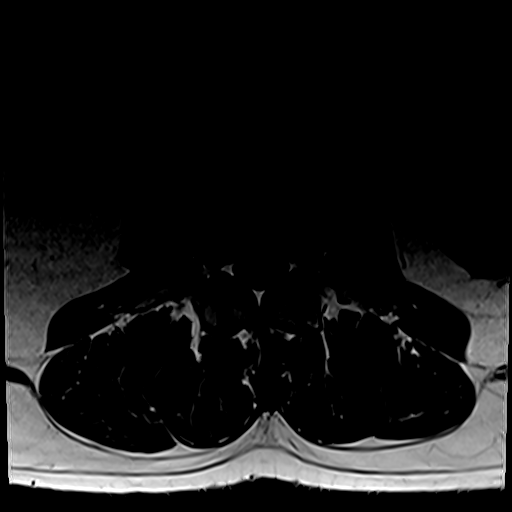
[im 31/43]
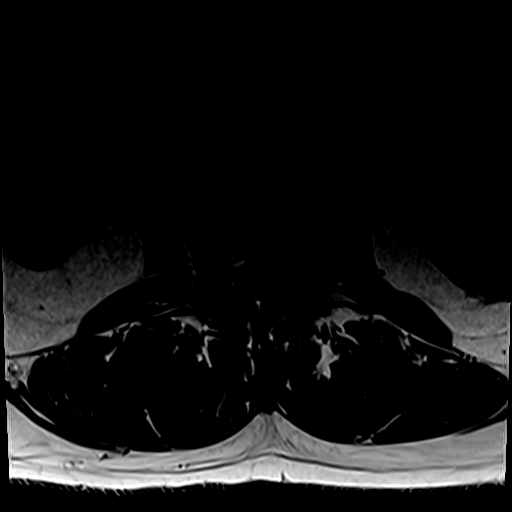
[im 37/43]
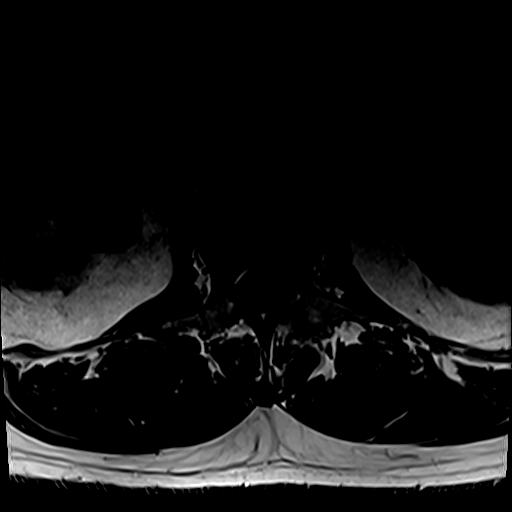
[im 43/43]
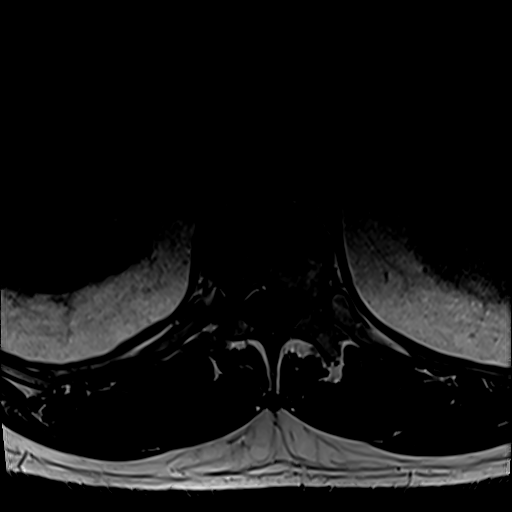

[35 of 48 positions shown; findings below may reference images not displayed]

FINDINGS: MRI THORACIC SPINE FINDINGS

Alignment:  Normal.

Vertebrae: No fracture or worrisome lesion. Hemangioma in T7
incidentally noted. A few small Schmorl's nodes are seen in the
lower thoracic spine.

Cord:  Normal signal throughout.

Paraspinal and other soft tissues: Negative.

Disc levels:

T3-4: Very shallow left paracentral protrusion without stenosis.

T6-7: Shallow right paracentral protrusion with cephalad extension.
No stenosis.

T8-9: Shallow right paracentral protrusion without stenosis.

Except as described, intervertebral disc spaces are negative.

MRI LUMBAR SPINE FINDINGS

Segmentation:  Standard.

Alignment:  Normal.

Vertebrae:  No fracture, evidence of discitis, or bone lesion.

Conus medullaris and cauda equina: Conus extends to the L1 level.
Conus and cauda equina appear normal.

Paraspinal and other soft tissues: Parapelvic left renal cyst versus
prominent renal pelvis noted.

Disc levels:

L1-2: Negative.

L2-3: Negative.

L3-4: The patient has a large broad-based central disc protrusion.
There is also some ligamentum flavum thickening and mild to moderate
facet arthropathy. Severe central canal and bilateral subarticular
recess narrowing is present. Protruding disc just beyond the right
foramen contacts the right L3 root without compression or
displacement.

L4-5: Shallow central protrusion without stenosis.

L5-S1: Negative.
IMPRESSION: MR THORACIC SPINE IMPRESSION

Mild thoracic degenerative change without stenosis.

MR LUMBAR SPINE IMPRESSION

Severe central canal and bilateral subarticular recess narrowing at
L3-4 where there is a large broad-based central protrusion,
ligamentum flavum thickening and mild to moderate facet degenerative
disease. Protruding disc in the far periphery of the right foramen
at L3-4 contacts the L3 root without compression or displacement.

## 2021-01-03 IMAGING — MR MR LUMBAR SPINE W/O CM
8 of 11 series · 35 of 48 positions shown · non-contrast
Comparison: None.

CLINICAL DATA: Onset left foot drop last night. Left leg tingling
for several days.

EXAM:
MRI THORACIC AND LUMBAR SPINE WITHOUT CONTRAST
TECHNIQUE: Multiplanar and multiecho pulse sequences of the thoracic and lumbar
spine were obtained without intravenous contrast.

[Series 16: T1 · sagittal · 4.0mm · 1.72mm/px · 1 of 5 slices shown (1 of 4)]
[im 1/5]
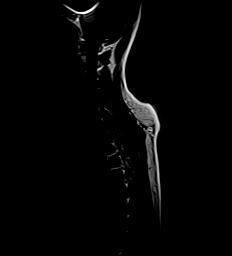

[Series 17: T2 · sagittal · 3.0mm · 0.91mm/px · 2 of 16 slices shown (1 of 4)]
[im 1/16]
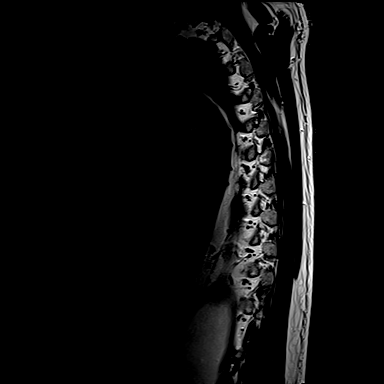
[im 16/16]
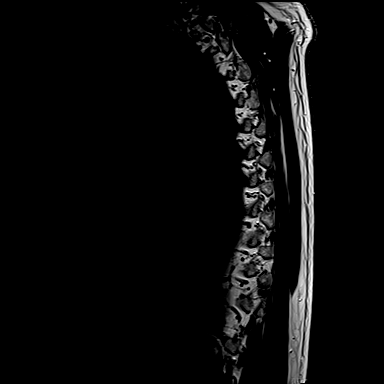

[Series 19: T1 · sagittal · 3.0mm · 1.09mm/px · 3 of 15 slices shown (2 of 4)]
[im 1/15]
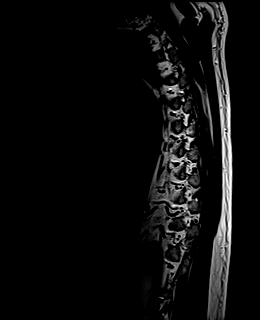
[im 8/15]
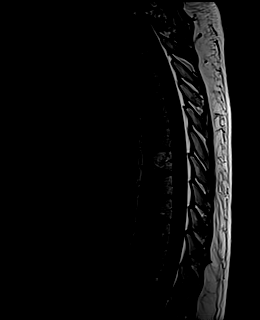
[im 15/15]
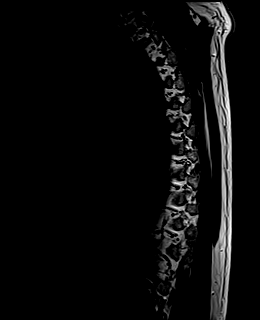

[Series 20: T2 · axial · 4.0mm · 0.78mm/px · z∈[-340,-102]mm · 7 of 42 slices shown (2 of 4)]
[im 1/42]
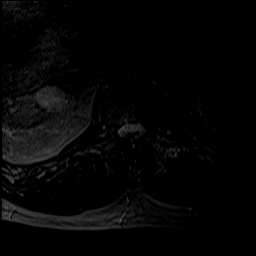
[im 7/42]
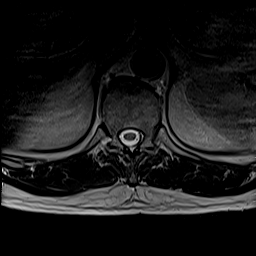
[im 14/42]
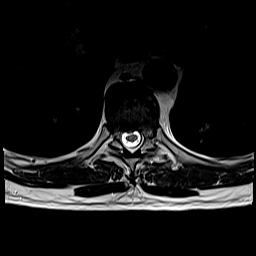
[im 21/42]
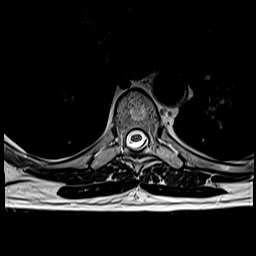
[im 28/42]
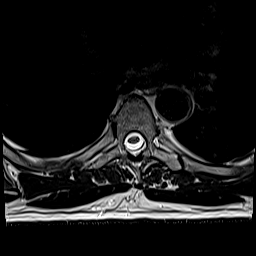
[im 35/42]
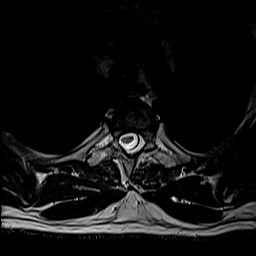
[im 42/42]
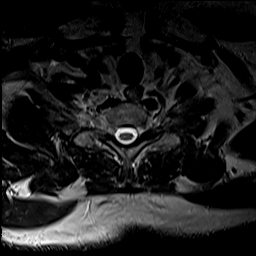

[Series 22: T1 · sagittal · 4.0mm · 0.81mm/px · 3 of 17 slices shown (3 of 4)]
[im 1/17]
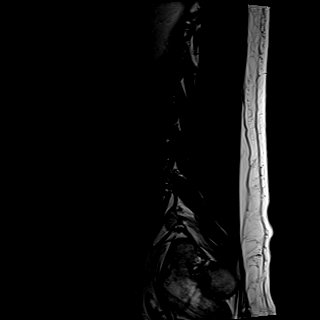
[im 9/17]
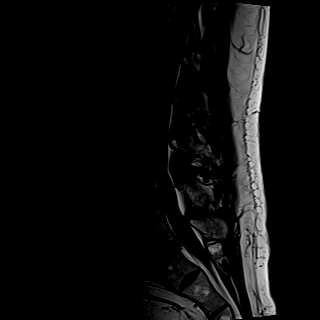
[im 17/17]
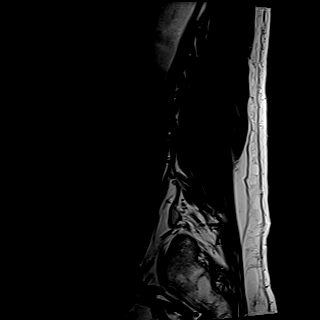

[Series 23: T2 · sagittal · 4.0mm · 0.81mm/px · 3 of 17 slices shown (3 of 4)]
[im 1/17]
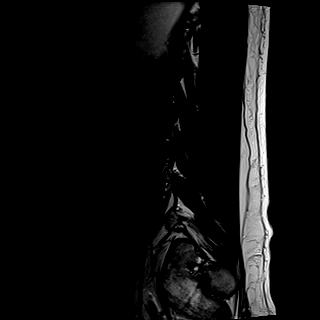
[im 9/17]
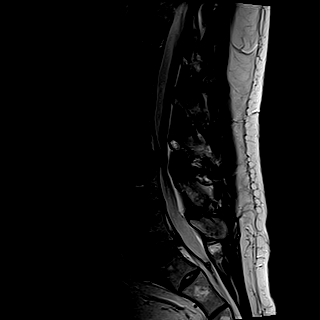
[im 17/17]
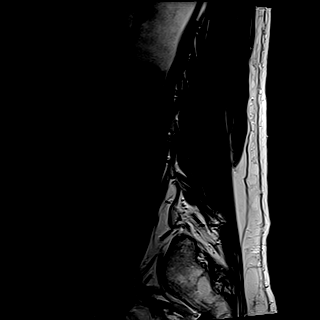

[Series 25: T2 · axial · 4.0mm · 0.62mm/px · z∈[-580,-344]mm · 8 of 43 slices shown (4 of 4)]
[im 1/43]
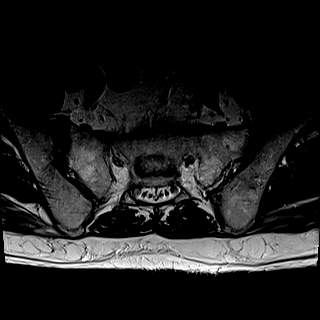
[im 7/43]
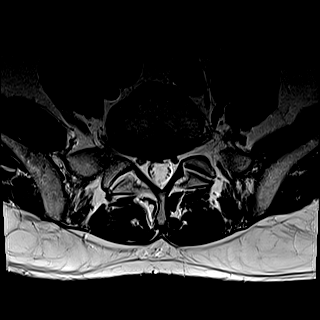
[im 13/43]
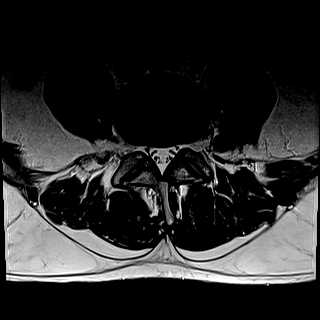
[im 19/43]
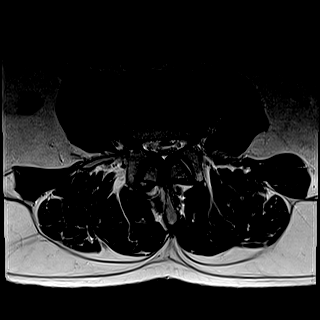
[im 25/43]
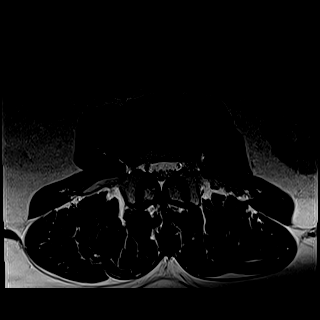
[im 31/43]
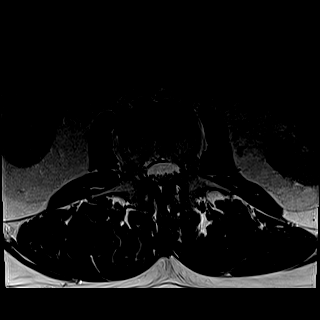
[im 37/43]
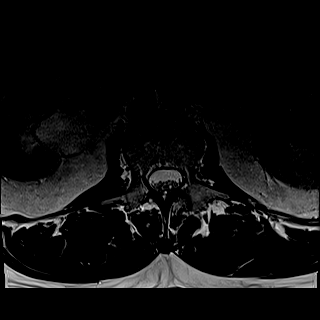
[im 43/43]
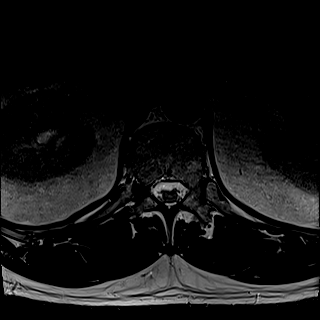

[Series 26: T1 · axial · 4.0mm · 0.39mm/px · z∈[-580,-344]mm · 8 of 43 slices shown (4 of 4)]
[im 1/43]
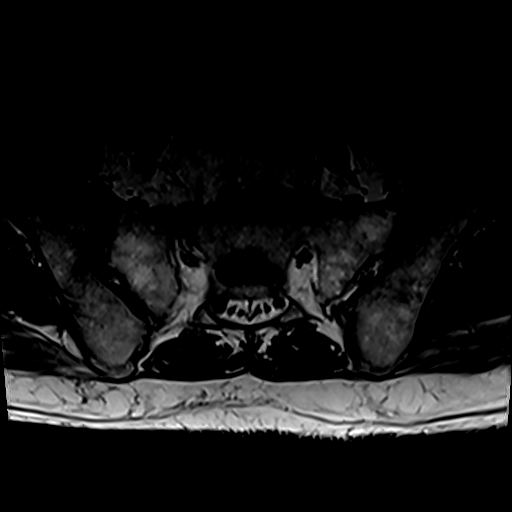
[im 7/43]
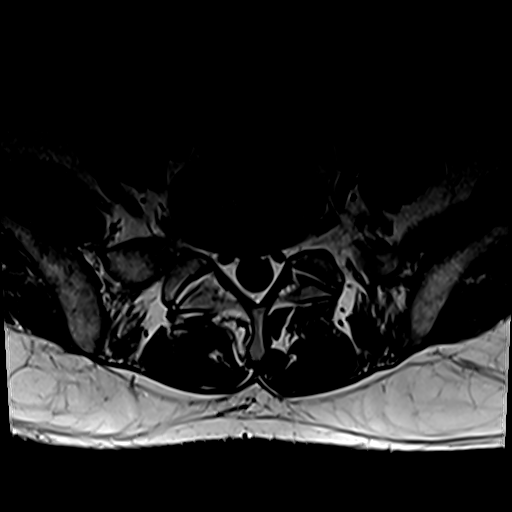
[im 13/43]
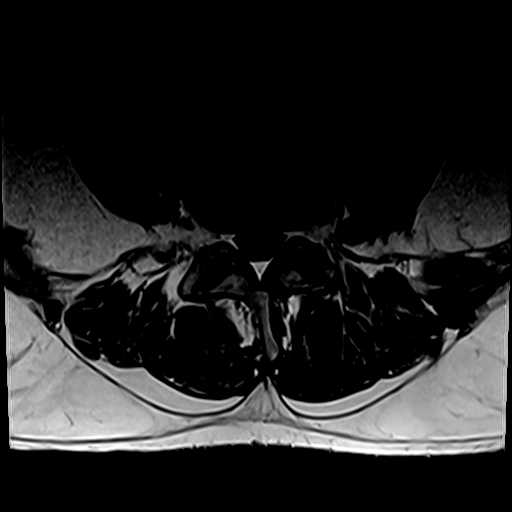
[im 19/43]
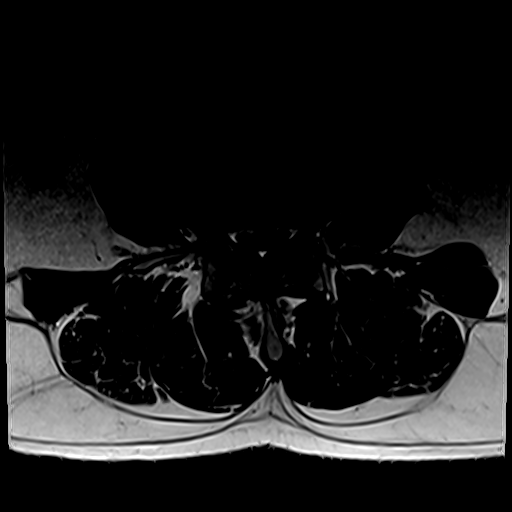
[im 25/43]
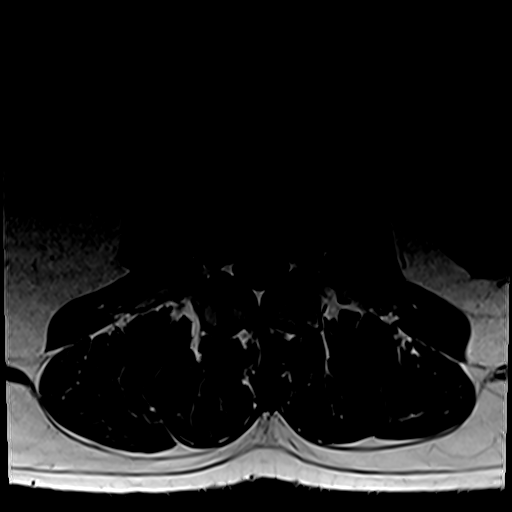
[im 31/43]
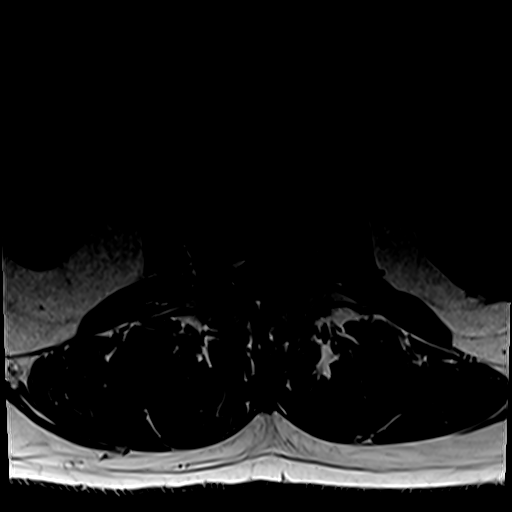
[im 37/43]
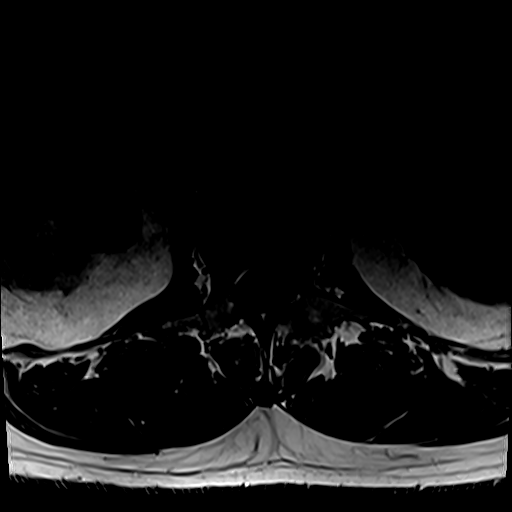
[im 43/43]
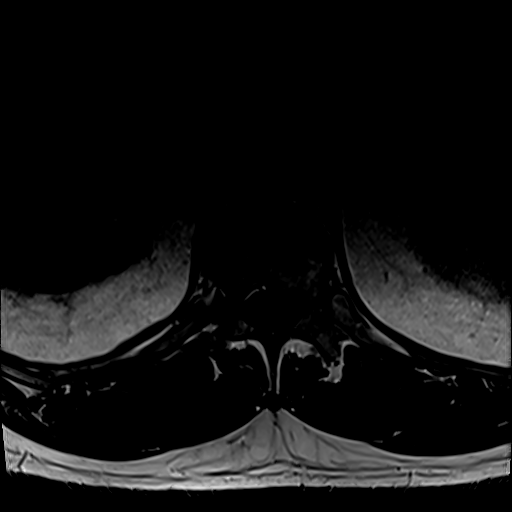

[35 of 48 positions shown; findings below may reference images not displayed]

FINDINGS: MRI THORACIC SPINE FINDINGS

Alignment:  Normal.

Vertebrae: No fracture or worrisome lesion. Hemangioma in T7
incidentally noted. A few small Schmorl's nodes are seen in the
lower thoracic spine.

Cord:  Normal signal throughout.

Paraspinal and other soft tissues: Negative.

Disc levels:

T3-4: Very shallow left paracentral protrusion without stenosis.

T6-7: Shallow right paracentral protrusion with cephalad extension.
No stenosis.

T8-9: Shallow right paracentral protrusion without stenosis.

Except as described, intervertebral disc spaces are negative.

MRI LUMBAR SPINE FINDINGS

Segmentation:  Standard.

Alignment:  Normal.

Vertebrae:  No fracture, evidence of discitis, or bone lesion.

Conus medullaris and cauda equina: Conus extends to the L1 level.
Conus and cauda equina appear normal.

Paraspinal and other soft tissues: Parapelvic left renal cyst versus
prominent renal pelvis noted.

Disc levels:

L1-2: Negative.

L2-3: Negative.

L3-4: The patient has a large broad-based central disc protrusion.
There is also some ligamentum flavum thickening and mild to moderate
facet arthropathy. Severe central canal and bilateral subarticular
recess narrowing is present. Protruding disc just beyond the right
foramen contacts the right L3 root without compression or
displacement.

L4-5: Shallow central protrusion without stenosis.

L5-S1: Negative.
IMPRESSION: MR THORACIC SPINE IMPRESSION

Mild thoracic degenerative change without stenosis.

MR LUMBAR SPINE IMPRESSION

Severe central canal and bilateral subarticular recess narrowing at
L3-4 where there is a large broad-based central protrusion,
ligamentum flavum thickening and mild to moderate facet degenerative
disease. Protruding disc in the far periphery of the right foramen
at L3-4 contacts the L3 root without compression or displacement.

## 2021-01-03 MED ORDER — OXYCODONE-ACETAMINOPHEN 5-325 MG PO TABS: 1.0000 | ORAL_TABLET | ORAL | 0 refills | Status: DC | PRN

## 2021-01-03 NOTE — ED Provider Notes (Signed)
[facility] DEPT Provider Note   CSN: [id number] Arrival date & time: 01/03/21  1234     History Chief Complaint  Patient presents with  . Leg Pain    Cory Moore is a 60 y.o. male.  The history is provided by the patient and medical records.  Leg Pain  Cory Moore is a 60 y.o. male who presents to the Emergency Department complaining of leg pain.  He presents to the ED complaining of several weeks of progressive low back/left hip/leg pain. At times pain radiates down both legs. Pain is worse with standing and ambulation. He saw an orthopedic doctor on Wednesday and was diagnosed with bilateral sciatica. He had plain films performed at that time. He was started on prednisone and muscle relaxant. He has been taking medications as prescribed. He was initially feeling improved. Yesterday he went to a hockey game in [place]. He states that walking to the game significantly worsened his pain. Now pain is severe when he stands. He has numbness to the left medial foot and difficulty lifting his foot. He has a history of hypothyroid, hyperlipidemia. No additional medical problems. No fevers, night sweats, weight loss. No personal history of malignancy. He does not use drugs. No recent injuries.     Past Medical History:  Diagnosis Date  . Anxiety    a lot of stressors. panic attacks in younger years.  . ED (erectile dysfunction)   . GERD (gastroesophageal reflux disease)    very mild  . Hyperlipidemia   . Thyroid disease   . Tobacco abuse     Patient Active Problem List   Diagnosis Date Noted  . B12 deficiency 09/10/2019  . History of adenomatous polyp of colon 03/24/2018  . Former smoker 12/12/2012  . ERECTILE DYSFUNCTION 10/03/2008  . Hypothyroidism 10/02/2007  . Hyperlipidemia 10/02/2007    Past Surgical History:  Procedure Laterality Date  . APPENDECTOMY  1998  . COLONOSCOPY    . HERNIA REPAIR  2003   right inguinal  . POLYPECTOMY          Family History  Problem Relation Age of Onset  . Hypertension Mother   . Hyperlipidemia Mother   . Colon polyps Mother   . Hypertension Father   . Hyperlipidemia Father   . Hyperlipidemia Sister   . Hyperlipidemia Brother   . Colon cancer Neg Hx   . Rectal cancer Neg Hx   . Stomach cancer Neg Hx   . Esophageal cancer Neg Hx     Social History   Tobacco Use  . Smoking status: Former Smoker    Packs/day: 1.00    Years: 25.00    Pack years: 25.00    Types: E-cigarettes    Quit date: 11/15/2002    Years since quitting: 18.1  . Smokeless tobacco: Never Used  . Tobacco comment: but still on e cigs daily  Substance Use Topics  . Alcohol use: Yes    Alcohol/week: 2.0 standard drinks    Types: 1 Glasses of wine, 1 Shots of liquor per week    Comment: socially - 1-2 a week   . Drug use: No    Home Medications Prior to Admission medications   Medication Sig Start Date End Date Taking? Authorizing Provider  oxyCODONE-acetaminophen (PERCOCET/ROXICET) 5-325 MG tablet Take 1 tablet by mouth every 4 (four) hours as needed for severe pain. 01/03/21  Yes Quintella Reichert, MD  baclofen (LIORESAL) 10 MG tablet Take 0.5-1 tablets (5-10 mg total)  by mouth 3 (three) times daily as needed for muscle spasms. 12/31/20   Hilts, Legrand Como, MD  levothyroxine (SYNTHROID) 125 MCG tablet Take 1 tablet (125 mcg total) by mouth daily. 09/30/20   Marin Olp, MD  predniSONE (DELTASONE) 10 MG tablet Take as directed for 12 days.  Daily dose 6,6,5,5,4,4,3,3,2,2,1,1. 12/31/20   Hilts, Legrand Como, MD  sildenafil (VIAGRA) 100 MG tablet TAKE 1/2 TO 1 TABLET BY MOUTH DAILY AS NEEDED FOR ERECTILE DYSFUNCTION 12/11/20   Marin Olp, MD  simvastatin (ZOCOR) 40 MG tablet TAKE 1 TABLET BY MOUTH EVERY DAY IN THE EVENING 09/30/20   Marin Olp, MD    Allergies    Patient has no known allergies.  Review of Systems   Review of Systems  All other systems reviewed and are negative.   Physical  Exam Updated Vital Signs BP (!) 143/83 (BP Location: Right Arm)   Pulse 97   Temp 97.6 F (36.4 C) (Oral)   Resp 17   SpO2 95%   Physical Exam Vitals and nursing note reviewed.  Constitutional:      Appearance: He is well-developed and well-nourished.  HENT:     Head: Normocephalic and atraumatic.  Cardiovascular:     Rate and Rhythm: Normal rate and regular rhythm.     Heart sounds: No murmur heard.   Pulmonary:     Effort: Pulmonary effort is normal. No respiratory distress.     Breath sounds: Normal breath sounds.  Abdominal:     Palpations: Abdomen is soft.     Tenderness: There is no abdominal tenderness. There is no guarding or rebound.  Musculoskeletal:        General: No tenderness or edema.     Comments: 2+ pedal pulses bilaterally  Skin:    General: Skin is warm and dry.  Neurological:     Mental Status: He is alert and oriented to person, place, and time.     Comments: Antalgic gait. There is altered sensation to light touch over the medial aspect of the left foot. Dorsiflexion in the foot is absent. Five out of five strength in bilateral proximal lower extremities.  Psychiatric:        Mood and Affect: Mood and affect normal.        Behavior: Behavior normal.     ED Results / Procedures / Treatments   Labs (all labs ordered are listed, but only abnormal results are displayed) Labs Reviewed - No data to display  EKG None  Radiology MR THORACIC SPINE WO CONTRAST  Result Date: 01/03/2021 CLINICAL DATA:  Onset left foot drop last night. Left leg tingling for several days. EXAM: MRI THORACIC AND LUMBAR SPINE WITHOUT CONTRAST TECHNIQUE: Multiplanar and multiecho pulse sequences of the thoracic and lumbar spine were obtained without intravenous contrast. COMPARISON:  None. FINDINGS: MRI THORACIC SPINE FINDINGS Alignment:  Normal. Vertebrae: No fracture or worrisome lesion. Hemangioma in T7 incidentally noted. A few small Schmorl's nodes are seen in the lower  thoracic spine. Cord:  Normal signal throughout. Paraspinal and other soft tissues: Negative. Disc levels: T3-4: Very shallow left paracentral protrusion without stenosis. T6-7: Shallow right paracentral protrusion with cephalad extension. No stenosis. T8-9: Shallow right paracentral protrusion without stenosis. Except as described, intervertebral disc spaces are negative. MRI LUMBAR SPINE FINDINGS Segmentation:  Standard. Alignment:  Normal. Vertebrae:  No fracture, evidence of discitis, or bone lesion. Conus medullaris and cauda equina: Conus extends to the L1 level. Conus and cauda equina appear normal. Paraspinal and  other soft tissues: Parapelvic left renal cyst versus prominent renal pelvis noted. Disc levels: L1-2: Negative. L2-3: Negative. L3-4: The patient has a large broad-based central disc protrusion. There is also some ligamentum flavum thickening and mild to moderate facet arthropathy. Severe central canal and bilateral subarticular recess narrowing is present. Protruding disc just beyond the right foramen contacts the right L3 root without compression or displacement. L4-5: Shallow central protrusion without stenosis. L5-S1: Negative. IMPRESSION: MR THORACIC SPINE IMPRESSION Mild thoracic degenerative change without stenosis. MR LUMBAR SPINE IMPRESSION Severe central canal and bilateral subarticular recess narrowing at L3-4 where there is a large broad-based central protrusion, ligamentum flavum thickening and mild to moderate facet degenerative disease. Protruding disc in the far periphery of the right foramen at L3-4 contacts the L3 root without compression or displacement. Electronically Signed   By: Inge Rise M.D.   On: 01/03/2021 15:24   MR LUMBAR SPINE WO CONTRAST  Result Date: 01/03/2021 CLINICAL DATA:  Onset left foot drop last night. Left leg tingling for several days. EXAM: MRI THORACIC AND LUMBAR SPINE WITHOUT CONTRAST TECHNIQUE: Multiplanar and multiecho pulse sequences of the  thoracic and lumbar spine were obtained without intravenous contrast. COMPARISON:  None. FINDINGS: MRI THORACIC SPINE FINDINGS Alignment:  Normal. Vertebrae: No fracture or worrisome lesion. Hemangioma in T7 incidentally noted. A few small Schmorl's nodes are seen in the lower thoracic spine. Cord:  Normal signal throughout. Paraspinal and other soft tissues: Negative. Disc levels: T3-4: Very shallow left paracentral protrusion without stenosis. T6-7: Shallow right paracentral protrusion with cephalad extension. No stenosis. T8-9: Shallow right paracentral protrusion without stenosis. Except as described, intervertebral disc spaces are negative. MRI LUMBAR SPINE FINDINGS Segmentation:  Standard. Alignment:  Normal. Vertebrae:  No fracture, evidence of discitis, or bone lesion. Conus medullaris and cauda equina: Conus extends to the L1 level. Conus and cauda equina appear normal. Paraspinal and other soft tissues: Parapelvic left renal cyst versus prominent renal pelvis noted. Disc levels: L1-2: Negative. L2-3: Negative. L3-4: The patient has a large broad-based central disc protrusion. There is also some ligamentum flavum thickening and mild to moderate facet arthropathy. Severe central canal and bilateral subarticular recess narrowing is present. Protruding disc just beyond the right foramen contacts the right L3 root without compression or displacement. L4-5: Shallow central protrusion without stenosis. L5-S1: Negative. IMPRESSION: MR THORACIC SPINE IMPRESSION Mild thoracic degenerative change without stenosis. MR LUMBAR SPINE IMPRESSION Severe central canal and bilateral subarticular recess narrowing at L3-4 where there is a large broad-based central protrusion, ligamentum flavum thickening and mild to moderate facet degenerative disease. Protruding disc in the far periphery of the right foramen at L3-4 contacts the L3 root without compression or displacement. Electronically Signed   By: Inge Rise M.D.    On: 01/03/2021 15:24    Procedures Procedures   Medications Ordered in ED Medications - No data to display  ED Course  I have reviewed the triage vital signs and the nursing notes.  Pertinent labs & imaging results that were available during my care of the patient were reviewed by me and considered in my medical decision making (see chart for details).    MDM Rules/Calculators/A&P                         patient with progressive low back pain here for evaluation of acute worsening with new onset left foot drop since yesterday. He does have decreased dorsiflexion to the left foot, otherwise neurologically intact. Imaging  is significant for disc herniation at L3 L4. Discussed findings of studies with Dr. Inda Merlin, with orthopedics. He will see the patient in the office this week for planned outpatient surgery. Discussed with patient treatment plan, at home care and return precautions. Return precautions discussed.  Final Clinical Impression(s) / ED Diagnoses Final diagnoses:  Lumbar herniated disc    Rx / DC Orders ED Discharge Orders         Ordered    oxyCODONE-acetaminophen (PERCOCET/ROXICET) 5-325 MG tablet  Every 4 hours PRN        01/03/21 1620           Quintella Reichert, MD 01/03/21 1625

## 2021-01-03 NOTE — ED Triage Notes (Signed)
Patient reports dx with sciatica this week at orthopedics. Reports worsening tingling to left leg. States taking prednisone and muscle relaxer as prescribed. Ambulatory with crutches.

## 2021-01-04 ENCOUNTER — Ambulatory Visit (HOSPITAL_COMMUNITY): Payer: Self-pay

## 2021-01-05 ENCOUNTER — Encounter: Payer: Self-pay | Admitting: Orthopaedic Surgery

## 2021-01-05 ENCOUNTER — Other Ambulatory Visit (HOSPITAL_COMMUNITY)
Admission: RE | Admit: 2021-01-05 | Discharge: 2021-01-05 | Disposition: A | Discharge status: Home / Self Care | Payer: 59 | Source: Ambulatory Visit | Attending: Orthopaedic Surgery | Admitting: Orthopaedic Surgery

## 2021-01-05 ENCOUNTER — Other Ambulatory Visit: Payer: Self-pay

## 2021-01-05 ENCOUNTER — Ambulatory Visit (INDEPENDENT_AMBULATORY_CARE_PROVIDER_SITE_OTHER): Payer: 59 | Admitting: Orthopaedic Surgery

## 2021-01-05 DIAGNOSIS — Z01812 Encounter for preprocedural laboratory examination: Secondary | ICD-10-CM | POA: Insufficient documentation

## 2021-01-05 DIAGNOSIS — M48062 Spinal stenosis, lumbar region with neurogenic claudication: Secondary | ICD-10-CM

## 2021-01-05 DIAGNOSIS — M21372 Foot drop, left foot: Secondary | ICD-10-CM | POA: Diagnosis not present

## 2021-01-05 DIAGNOSIS — Z20822 Contact with and (suspected) exposure to covid-19: Secondary | ICD-10-CM | POA: Insufficient documentation

## 2021-01-05 DIAGNOSIS — M48061 Spinal stenosis, lumbar region without neurogenic claudication: Secondary | ICD-10-CM | POA: Insufficient documentation

## 2021-01-05 NOTE — Progress Notes (Signed)
Office Visit Note   Patient: Cory Moore           Date of Birth: 01-14-1961           MRN: [id number] Visit Date: 01/05/2021              Requested by: Marin Olp, MD [place],  [place] [address] PCP: Marin Olp, MD   Assessment & Plan: Visit Diagnoses:  1. Foot drop, left   2. Spinal stenosis of lumbar region with neurogenic claudication     Plan: Plan to be single level lumbar decompression at L3-4 with microdiscectomy left side for his acute severe central stenosis with acute left foot drop. Patient has small disc protrusion at L4-5 but no compression neither right or left side and no significant central compression at that level. We discussed single level decompression with microdiscectomy. We discussed risk of cauda equina syndrome developing since he is narrowed down to the 5 to 6 mm range at L3-4. Plan would be decompression overnight stay. He understands that he may have only partial improvement in the foot drop for period of time or may get immediate improvement. Risks of dural tear anesthetic risk discussed. Questions elicited and answered he understands request to proceed.  Follow-Up Instructions: No follow-ups on file.   Orders:  No orders of the defined types were placed in this encounter.  No orders of the defined types were placed in this encounter.     Procedures: No procedures performed   Clinical Data: No additional findings.   Subjective: Chief Complaint  Patient presents with  . Lower Back - Pain    HPI 60 year old male normally enjoys hiking with his wife is seen with 5-day history of severe excruciating back and left leg pain with weakness. He been followed by Dr. Junius Roads for several months treated with exercise program baclofen, prednisone for back pain and left leg pain. Patient presented to the emergency room because he could not stand had trouble walking. He is having increased back pain after 12/1620 and states  prednisone helped him and then when he was moving better he went to a hockey game in [place] when he came back he noticed he had foot drop on the left and could not stand up. Emergency room he was given crutches. He states his leg gives way does have the crutches and he will fall. Increased pain with standing and also attempts at walking more than a few steps. Patient's general health has been good he had hernia repair with mesh in his 86s appendectomy in his 47s. He was given oxycodone for pain he finished the prednisone Dosepak still has a baclofen.  Emergent MRI done emergency room showed some tiny disc bulges in the thoracic spine without compression. Severe stenosis L3-4 with large central disc protrusion. Narrowing down less than 6 mm centrally where other levels are at 15 mm. Patient had ligamentous thickening at L3-4 with severe central stenosis.  Review of Systems all other systems are negative no history of heart trouble has been active likes to kayak hike.   Objective: Vital Signs: BP 131/76   Pulse 76   Ht 6\' 4"  (1.93 m)   Wt 265 lb (120.2 kg)   BMI 32.26 kg/m   Physical Exam Constitutional:      Appearance: He is well-developed and well-nourished.  HENT:     Head: Normocephalic and atraumatic.  Eyes:     Extraocular Movements: EOM normal.  Pupils: Pupils are equal, round, and reactive to light.  Neck:     Thyroid: No thyromegaly.     Trachea: No tracheal deviation.  Cardiovascular:     Rate and Rhythm: Normal rate.  Pulmonary:     Effort: Pulmonary effort is normal.     Breath sounds: No wheezing.  Abdominal:     General: Bowel sounds are normal.     Palpations: Abdomen is soft.  Skin:    General: Skin is warm and dry.     Capillary Refill: Capillary refill takes less than 2 seconds.  Neurological:     Mental Status: He is alert and oriented to person, place, and time.  Psychiatric:        Mood and Affect: Mood and affect normal.        Behavior: Behavior  normal.        Thought Content: Thought content normal.        Judgment: Judgment normal.     Ortho Exam positive straight leg raising on the left at 50 degrees. Patient has foot drop with 3+ out of 5 ankle dorsiflexion on the left EHL is 4- out of 5. Opposite right side is strong. Plantarflexion is strong. No saddle anesthesia. Skin over the lumbar region is normal pedal pulses are normal.  Specialty Comments:  No specialty comments available.  Imaging: CLINICAL DATA:  Onset left foot drop last night. Left leg tingling for several days.  EXAM: MRI THORACIC AND LUMBAR SPINE WITHOUT CONTRAST  TECHNIQUE: Multiplanar and multiecho pulse sequences of the thoracic and lumbar spine were obtained without intravenous contrast.  COMPARISON:  None.  FINDINGS: MRI THORACIC SPINE FINDINGS  Alignment:  Normal.  Vertebrae: No fracture or worrisome lesion. Hemangioma in T7 incidentally noted. A few small Schmorl's nodes are seen in the lower thoracic spine.  Cord:  Normal signal throughout.  Paraspinal and other soft tissues: Negative.  Disc levels:  T3-4: Very shallow left paracentral protrusion without stenosis.  T6-7: Shallow right paracentral protrusion with cephalad extension. No stenosis.  T8-9: Shallow right paracentral protrusion without stenosis.  Except as described, intervertebral disc spaces are negative.  MRI LUMBAR SPINE FINDINGS  Segmentation:  Standard.  Alignment:  Normal.  Vertebrae:  No fracture, evidence of discitis, or bone lesion.  Conus medullaris and cauda equina: Conus extends to the L1 level. Conus and cauda equina appear normal.  Paraspinal and other soft tissues: Parapelvic left renal cyst versus prominent renal pelvis noted.  Disc levels:  L1-2: Negative.  L2-3: Negative.  L3-4: The patient has a large broad-based central disc protrusion. There is also some ligamentum flavum thickening and mild to  moderate facet arthropathy. Severe central canal and bilateral subarticular recess narrowing is present. Protruding disc just beyond the right foramen contacts the right L3 root without compression or displacement.  L4-5: Shallow central protrusion without stenosis.  L5-S1: Negative.  IMPRESSION: MR THORACIC SPINE IMPRESSION  Mild thoracic degenerative change without stenosis.  MR LUMBAR SPINE IMPRESSION  Severe central canal and bilateral subarticular recess narrowing at L3-4 where there is a large broad-based central protrusion, ligamentum flavum thickening and mild to moderate facet degenerative disease. Protruding disc in the far periphery of the right foramen at L3-4 contacts the L3 root without compression or displacement.   Electronically Signed   By: Inge Rise M.D.   On: 01/03/2021 15:24    PMFS History: Patient Active Problem List   Diagnosis Date Noted  . Foot drop, left 01/05/2021  . Spinal  stenosis of lumbar region 01/05/2021  . B12 deficiency 09/10/2019  . History of adenomatous polyp of colon 03/24/2018  . Former smoker 12/12/2012  . ERECTILE DYSFUNCTION 10/03/2008  . Hypothyroidism 10/02/2007  . Hyperlipidemia 10/02/2007   Past Medical History:  Diagnosis Date  . Anxiety    a lot of stressors. panic attacks in younger years.  . ED (erectile dysfunction)   . GERD (gastroesophageal reflux disease)    very mild  . Hyperlipidemia   . Thyroid disease   . Tobacco abuse     Family History  Problem Relation Age of Onset  . Hypertension Mother   . Hyperlipidemia Mother   . Colon polyps Mother   . Hypertension Father   . Hyperlipidemia Father   . Hyperlipidemia Sister   . Hyperlipidemia Brother   . Colon cancer Neg Hx   . Rectal cancer Neg Hx   . Stomach cancer Neg Hx   . Esophageal cancer Neg Hx     Past Surgical History:  Procedure Laterality Date  . APPENDECTOMY  1998  . COLONOSCOPY    . HERNIA REPAIR  2003   right  inguinal  . POLYPECTOMY     Social History   Occupational History  . Not on file  Tobacco Use  . Smoking status: Former Smoker    Packs/day: 1.00    Years: 25.00    Pack years: 25.00    Types: E-cigarettes    Quit date: 11/15/2002    Years since quitting: 18.1  . Smokeless tobacco: Never Used  . Tobacco comment: but still on e cigs daily  Substance and Sexual Activity  . Alcohol use: Yes    Alcohol/week: 2.0 standard drinks    Types: 1 Glasses of wine, 1 Shots of liquor per week    Comment: socially - 1-2 a week   . Drug use: No  . Sexual activity: Not on file

## 2021-01-06 ENCOUNTER — Encounter (HOSPITAL_COMMUNITY): Payer: Self-pay | Admitting: Orthopaedic Surgery

## 2021-01-06 LAB — SARS CORONAVIRUS 2 (TAT 6-24 HRS): SARS Coronavirus 2: NEGATIVE

## 2021-01-06 NOTE — Progress Notes (Signed)
Cory Moore denies chest pain or shortness of breath. Patient tested  negative for Covid  01/05/21 and has been in quarantine since that time.  [name] is on a Prednisone, decreasing dose, he is on 10 mg, 4 times a day.  I instructed [name] that he may take am dose tomorrow.

## 2021-01-07 ENCOUNTER — Ambulatory Visit (HOSPITAL_COMMUNITY): Payer: 59 | Admitting: Certified Registered Nurse Anesthetist

## 2021-01-07 ENCOUNTER — Observation Stay (HOSPITAL_COMMUNITY)
Admission: RE | Admit: 2021-01-07 | Discharge: 2021-01-08 | Disposition: A | Discharge status: Home / Self Care | Payer: 59 | Attending: Orthopaedic Surgery | Admitting: Orthopaedic Surgery

## 2021-01-07 ENCOUNTER — Other Ambulatory Visit: Payer: Self-pay

## 2021-01-07 ENCOUNTER — Ambulatory Visit (HOSPITAL_COMMUNITY): Payer: 59

## 2021-01-07 ENCOUNTER — Encounter (HOSPITAL_COMMUNITY)
Admission: RE | Disposition: A | Discharge status: Home / Self Care | Payer: Self-pay | Source: Home / Self Care | Attending: Orthopaedic Surgery

## 2021-01-07 ENCOUNTER — Encounter (HOSPITAL_COMMUNITY): Payer: Self-pay | Admitting: Orthopaedic Surgery

## 2021-01-07 DIAGNOSIS — M48062 Spinal stenosis, lumbar region with neurogenic claudication: Secondary | ICD-10-CM | POA: Diagnosis not present

## 2021-01-07 DIAGNOSIS — E039 Hypothyroidism, unspecified: Secondary | ICD-10-CM | POA: Diagnosis not present

## 2021-01-07 DIAGNOSIS — Z79899 Other long term (current) drug therapy: Secondary | ICD-10-CM | POA: Insufficient documentation

## 2021-01-07 DIAGNOSIS — M5126 Other intervertebral disc displacement, lumbar region: Secondary | ICD-10-CM | POA: Diagnosis not present

## 2021-01-07 DIAGNOSIS — Z87891 Personal history of nicotine dependence: Secondary | ICD-10-CM | POA: Insufficient documentation

## 2021-01-07 DIAGNOSIS — Z419 Encounter for procedure for purposes other than remedying health state, unspecified: Secondary | ICD-10-CM

## 2021-01-07 DIAGNOSIS — M21372 Foot drop, left foot: Secondary | ICD-10-CM | POA: Insufficient documentation

## 2021-01-07 DIAGNOSIS — M48061 Spinal stenosis, lumbar region without neurogenic claudication: Secondary | ICD-10-CM | POA: Diagnosis present

## 2021-01-07 HISTORY — PX: LUMBAR LAMINECTOMY/DECOMPRESSION MICRODISCECTOMY: SHX5026

## 2021-01-07 HISTORY — DX: Hypothyroidism, unspecified: E03.9

## 2021-01-07 LAB — CBC
HCT: 44.2 % (ref 39.0–52.0)
Hemoglobin: 15.9 g/dL (ref 13.0–17.0)
MCH: 31.7 pg (ref 26.0–34.0)
MCHC: 36 g/dL (ref 30.0–36.0)
MCV: 88 fL (ref 80.0–100.0)
Platelets: 236 10*3/uL (ref 150–400)
RBC: 5.02 MIL/uL (ref 4.22–5.81)
RDW: 12.4 % (ref 11.5–15.5)
WBC: 6.7 10*3/uL (ref 4.0–10.5)
nRBC: 0 % (ref 0.0–0.2)

## 2021-01-07 LAB — COMPREHENSIVE METABOLIC PANEL
ALT: 30 U/L (ref 0–44)
AST: 27 U/L (ref 15–41)
Albumin: 4.2 g/dL (ref 3.5–5.0)
Alkaline Phosphatase: 38 U/L (ref 38–126)
Anion gap: 14 (ref 5–15)
BUN: 19 mg/dL (ref 6–20)
CO2: 20 mmol/L — ABNORMAL LOW (ref 22–32)
Calcium: 9.2 mg/dL (ref 8.9–10.3)
Chloride: 102 mmol/L (ref 98–111)
Creatinine, Ser: 0.92 mg/dL (ref 0.61–1.24)
GFR, Estimated: >60 mL/min (ref >60)
Glucose, Bld: 100 mg/dL — ABNORMAL HIGH (ref 70–99)
Potassium: 3.7 mmol/L (ref 3.5–5.1)
Sodium: 136 mmol/L (ref 135–145)
Total Bilirubin: 1.2 mg/dL (ref 0.3–1.2)
Total Protein: 6.6 g/dL (ref 6.5–8.1)

## 2021-01-07 LAB — SURGICAL PCR SCREEN
MRSA, PCR: NEGATIVE
Staphylococcus aureus: NEGATIVE

## 2021-01-07 IMAGING — CR DG LUMBAR SPINE 2-3V
2 series · 2 of 2 positions shown · non-contrast
Comparison: Cross-table lateral intraoperative images compared to
[DATE].

CLINICAL DATA: L3-L4 decompression and LEFT micro diskectomy

EXAM:
LUMBAR SPINE - 2-3 VIEW

[xtable lateral (1 of 2)]
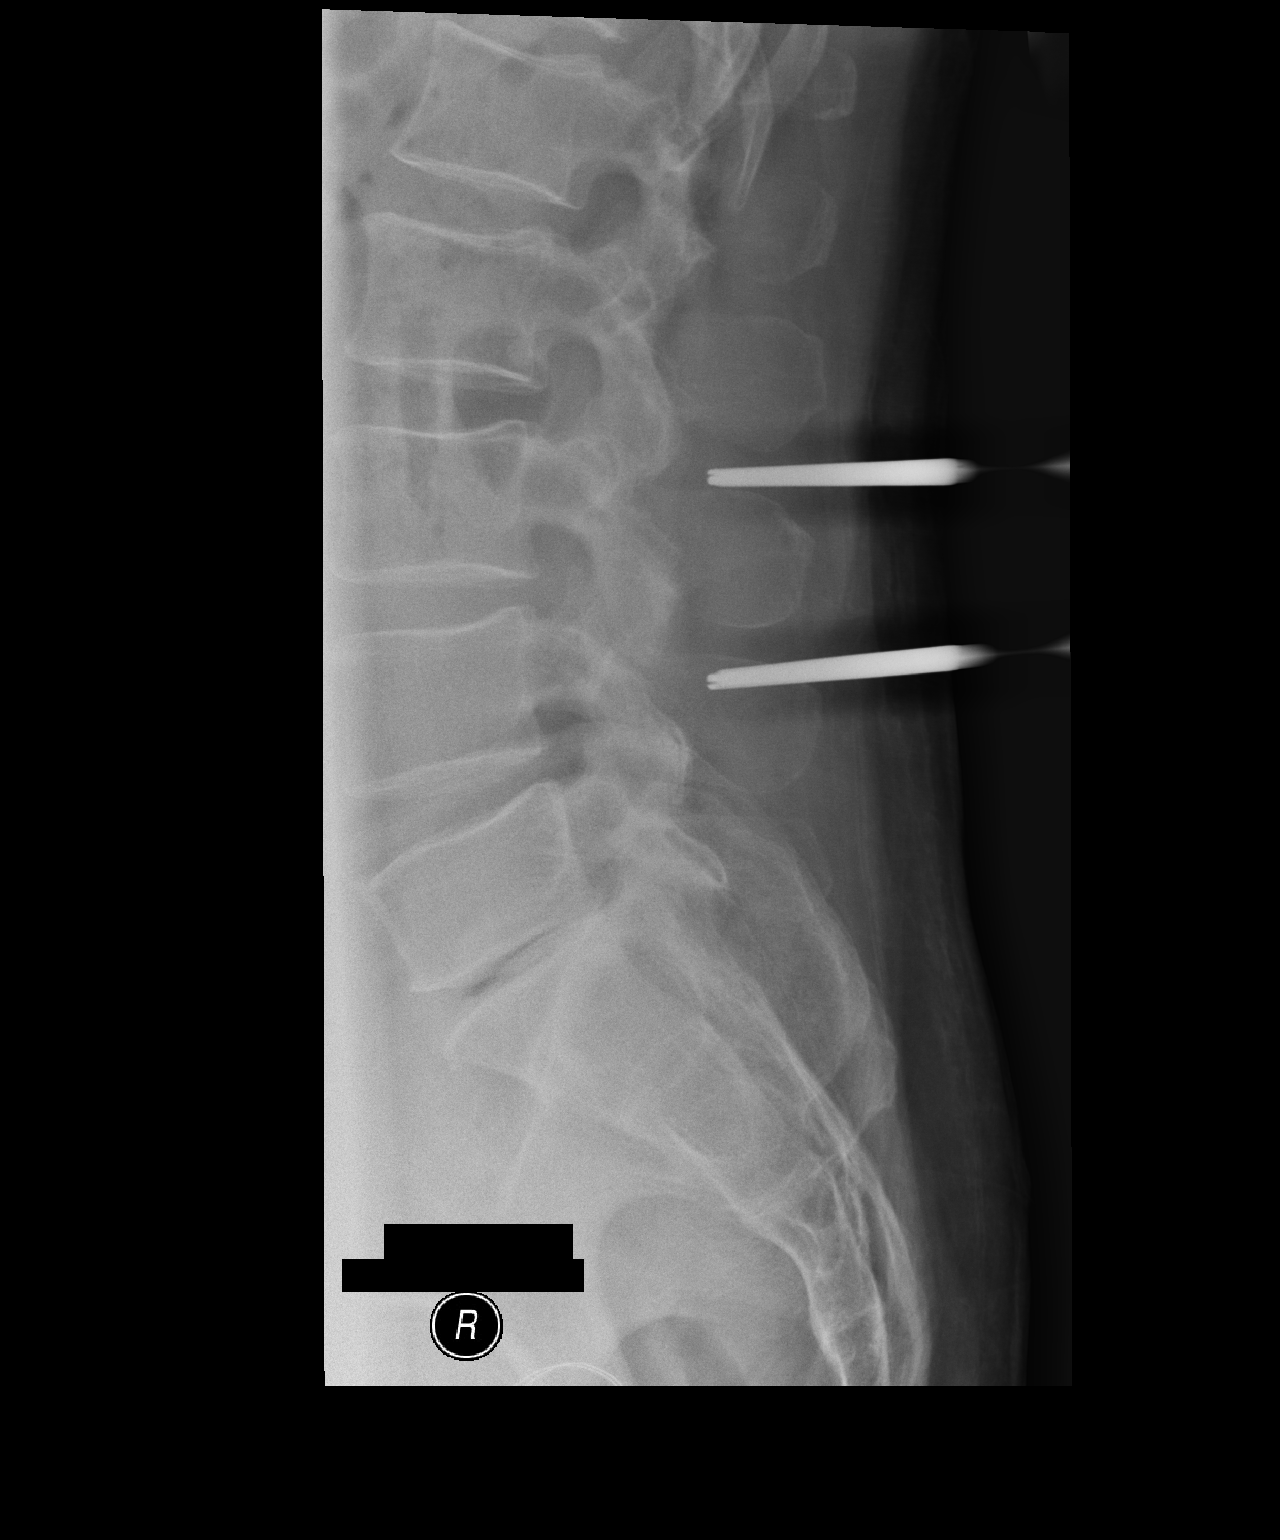

[xtable lateral (2 of 2)]
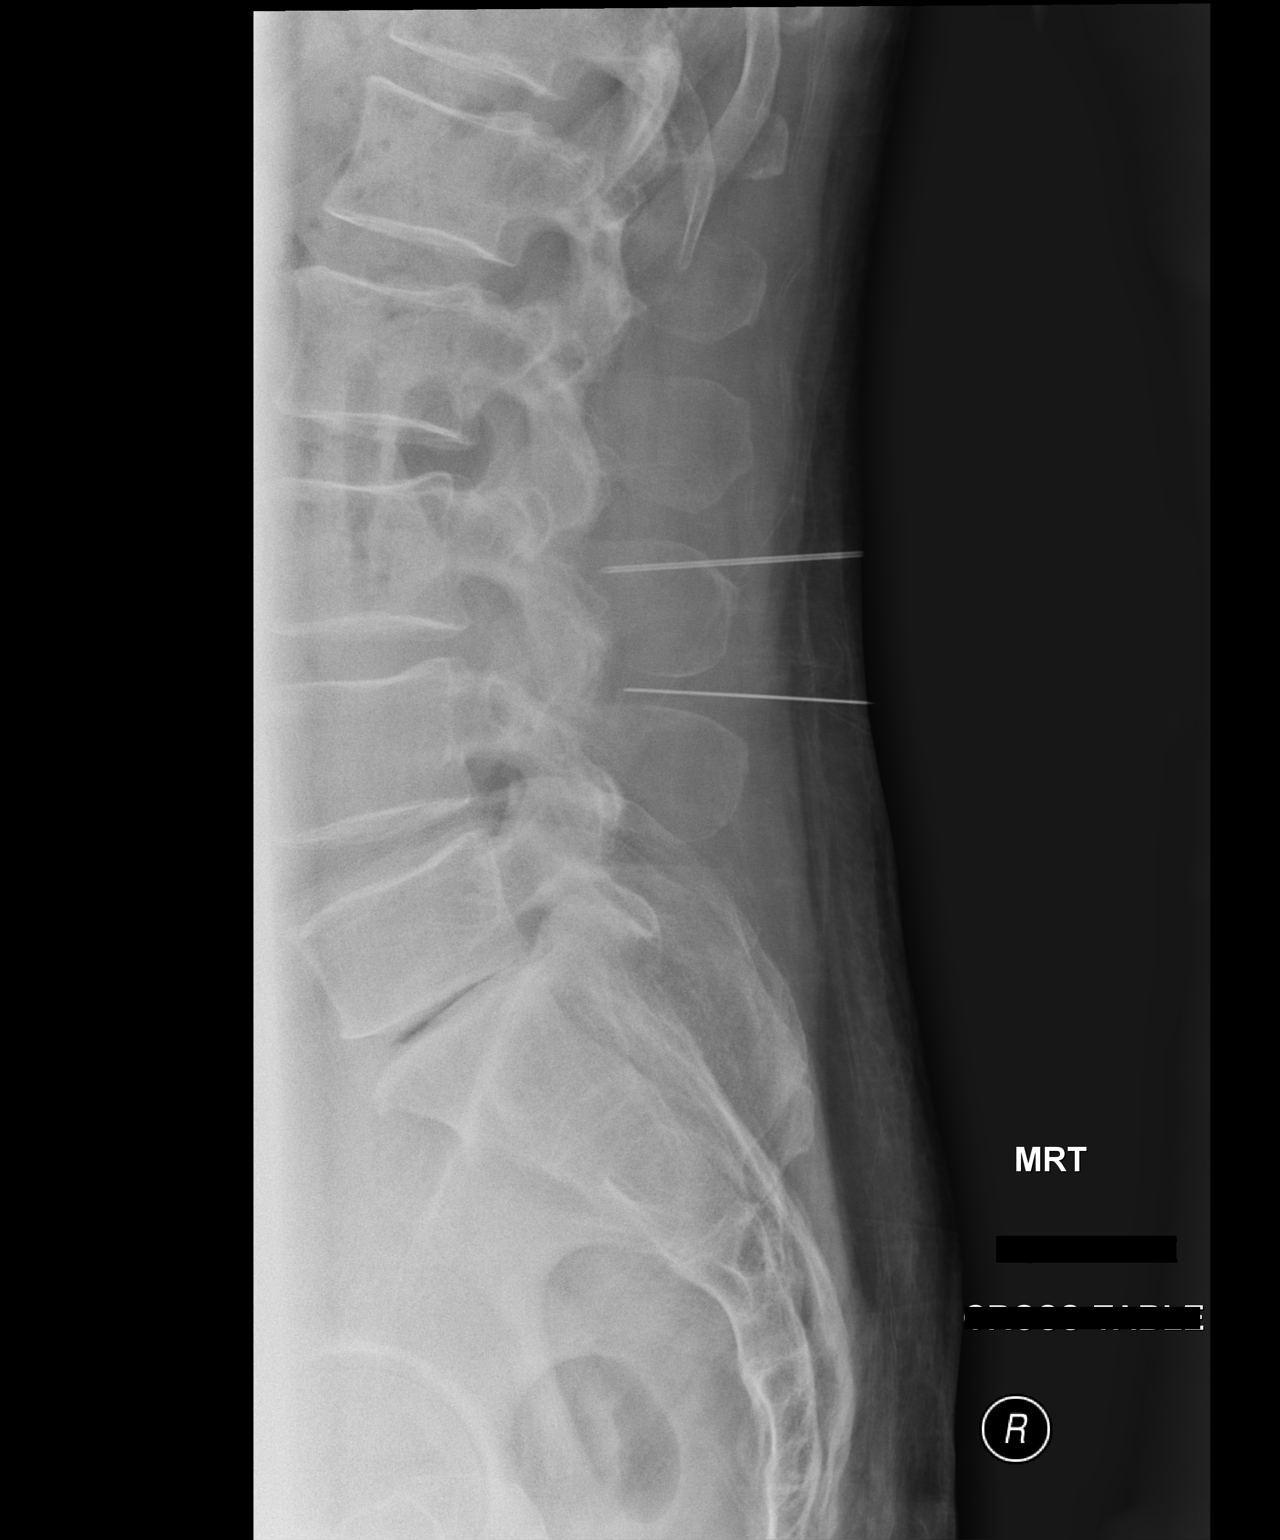

[2 of 2 positions shown; findings below may reference images not displayed]

FINDINGS: 5 non-rib-bearing lumbar type vertebra on prior exam.

Image at [QT] hours: Surgical instruments via dorsal approach
project dorsal to the L3 and L4 vertebral bodies.

Image at [QT] hours: Metallic probes project dorsal to the inferior
L3 and superior L4 levels.
IMPRESSION: Intraoperative lumbar localization images as above.

## 2021-01-07 SURGERY — LUMBAR LAMINECTOMY/DECOMPRESSION MICRODISCECTOMY
Anesthesia: General | Site: Spine Lumbar

## 2021-01-07 MED ORDER — ONDANSETRON HCL 4 MG PO TABS: 4.0000 mg | ORAL_TABLET | Freq: Four times a day (QID) | ORAL | Status: DC | PRN

## 2021-01-07 MED ORDER — ROCURONIUM BROMIDE 10 MG/ML (PF) SYRINGE
PREFILLED_SYRINGE | INTRAVENOUS | Status: AC
  Filled 2021-01-07: qty 10

## 2021-01-07 MED ORDER — OXYCODONE HCL 5 MG PO TABS
5.0000 mg | ORAL_TABLET | ORAL | Status: DC | PRN
  Administered 2021-01-07 – 2021-01-08 (×4): 5 mg via ORAL
  Filled 2021-01-07 (×4): qty 1

## 2021-01-07 MED ORDER — HYDROMORPHONE HCL 1 MG/ML IJ SOLN
0.2500 mg | INTRAMUSCULAR | Status: DC | PRN
Start: 2021-01-07 — End: 2021-01-07

## 2021-01-07 MED ORDER — LACTATED RINGERS IV SOLN: INTRAVENOUS | Status: DC

## 2021-01-07 MED ORDER — SIMVASTATIN 20 MG PO TABS
40.0000 mg | ORAL_TABLET | Freq: Every evening | ORAL | Status: DC
  Administered 2021-01-07: 40 mg via ORAL
  Filled 2021-01-07: qty 2

## 2021-01-07 MED ORDER — METHOCARBAMOL 1000 MG/10ML IJ SOLN
500.0000 mg | Freq: Four times a day (QID) | INTRAVENOUS | Status: DC | PRN
  Filled 2021-01-07: qty 5

## 2021-01-07 MED ORDER — LACTATED RINGERS IV SOLN: INTRAVENOUS | Status: DC | PRN

## 2021-01-07 MED ORDER — EPHEDRINE SULFATE-NACL 50-0.9 MG/10ML-% IV SOSY
PREFILLED_SYRINGE | INTRAVENOUS | Status: DC | PRN
  Administered 2021-01-07: 5 mg via INTRAVENOUS

## 2021-01-07 MED ORDER — HYDROMORPHONE HCL 1 MG/ML IJ SOLN: 0.5000 mg | INTRAMUSCULAR | Status: DC | PRN

## 2021-01-07 MED ORDER — CHLORHEXIDINE GLUCONATE 0.12 % MT SOLN
OROMUCOSAL | Status: AC
  Administered 2021-01-07: 15 mL via OROMUCOSAL
  Filled 2021-01-07: qty 15

## 2021-01-07 MED ORDER — DEXAMETHASONE SODIUM PHOSPHATE 10 MG/ML IJ SOLN
INTRAMUSCULAR | Status: AC
  Filled 2021-01-07: qty 1

## 2021-01-07 MED ORDER — CEFAZOLIN SODIUM-DEXTROSE 2-4 GM/100ML-% IV SOLN
INTRAVENOUS | Status: AC
  Filled 2021-01-07: qty 100

## 2021-01-07 MED ORDER — ACETAMINOPHEN 160 MG/5ML PO SOLN: 325.0000 mg | Freq: Once | ORAL | Status: DC | PRN

## 2021-01-07 MED ORDER — DEXTROSE 5 % IV SOLN
3.0000 g | INTRAVENOUS | Status: AC
  Administered 2021-01-07: 3 g via INTRAVENOUS
  Filled 2021-01-07: qty 3000

## 2021-01-07 MED ORDER — DEXAMETHASONE SODIUM PHOSPHATE 10 MG/ML IJ SOLN
INTRAMUSCULAR | Status: DC | PRN
  Administered 2021-01-07: 10 mg via INTRAVENOUS

## 2021-01-07 MED ORDER — BUPIVACAINE HCL (PF) 0.25 % IJ SOLN
INTRAMUSCULAR | Status: DC | PRN
  Administered 2021-01-07: 10 mL

## 2021-01-07 MED ORDER — ORAL CARE MOUTH RINSE: 15.0000 mL | Freq: Once | OROMUCOSAL | Status: AC

## 2021-01-07 MED ORDER — FENTANYL CITRATE (PF) 250 MCG/5ML IJ SOLN
INTRAMUSCULAR | Status: DC | PRN
  Administered 2021-01-07: 100 ug via INTRAVENOUS
  Administered 2021-01-07: 50 ug via INTRAVENOUS

## 2021-01-07 MED ORDER — SUGAMMADEX SODIUM 200 MG/2ML IV SOLN
INTRAVENOUS | Status: DC | PRN
  Administered 2021-01-07: 200 mg via INTRAVENOUS

## 2021-01-07 MED ORDER — PHENOL 1.4 % MT LIQD: 1.0000 | OROMUCOSAL | Status: DC | PRN

## 2021-01-07 MED ORDER — DOCUSATE SODIUM 100 MG PO CAPS
100.0000 mg | ORAL_CAPSULE | Freq: Two times a day (BID) | ORAL | Status: DC
  Administered 2021-01-07 – 2021-01-08 (×2): 100 mg via ORAL
  Filled 2021-01-07 (×2): qty 1

## 2021-01-07 MED ORDER — ACETAMINOPHEN 10 MG/ML IV SOLN: 1000.0000 mg | Freq: Once | INTRAVENOUS | Status: DC | PRN

## 2021-01-07 MED ORDER — LIDOCAINE 2% (20 MG/ML) 5 ML SYRINGE
INTRAMUSCULAR | Status: AC
  Filled 2021-01-07: qty 5

## 2021-01-07 MED ORDER — MEPERIDINE HCL 25 MG/ML IJ SOLN: 6.2500 mg | INTRAMUSCULAR | Status: DC | PRN

## 2021-01-07 MED ORDER — HEMOSTATIC AGENTS (NO CHARGE) OPTIME
TOPICAL | Status: DC | PRN
  Administered 2021-01-07: 1 via TOPICAL

## 2021-01-07 MED ORDER — PROPOFOL 10 MG/ML IV BOLUS
INTRAVENOUS | Status: DC | PRN
  Administered 2021-01-07 (×2): 20 mg via INTRAVENOUS
  Administered 2021-01-07: 30 mg via INTRAVENOUS
  Administered 2021-01-07: 200 mg via INTRAVENOUS

## 2021-01-07 MED ORDER — SODIUM CHLORIDE 0.9 % IV SOLN: INTRAVENOUS | Status: DC

## 2021-01-07 MED ORDER — POLYETHYLENE GLYCOL 3350 17 G PO PACK: 17.0000 g | PACK | Freq: Every day | ORAL | Status: DC | PRN

## 2021-01-07 MED ORDER — PHENYLEPHRINE HCL (PRESSORS) 10 MG/ML IV SOLN
INTRAVENOUS | Status: AC
  Filled 2021-01-07: qty 1

## 2021-01-07 MED ORDER — ACETAMINOPHEN 650 MG RE SUPP: 650.0000 mg | RECTAL | Status: DC | PRN

## 2021-01-07 MED ORDER — FENTANYL CITRATE (PF) 250 MCG/5ML IJ SOLN
INTRAMUSCULAR | Status: AC
  Filled 2021-01-07: qty 5

## 2021-01-07 MED ORDER — 0.9 % SODIUM CHLORIDE (POUR BTL) OPTIME
TOPICAL | Status: DC | PRN
  Administered 2021-01-07: 1000 mL

## 2021-01-07 MED ORDER — CHLORHEXIDINE GLUCONATE 0.12 % MT SOLN: 15.0000 mL | Freq: Once | OROMUCOSAL | Status: AC

## 2021-01-07 MED ORDER — SODIUM CHLORIDE 0.9% FLUSH: 3.0000 mL | INTRAVENOUS | Status: DC | PRN

## 2021-01-07 MED ORDER — PHENYLEPHRINE 40 MCG/ML (10ML) SYRINGE FOR IV PUSH (FOR BLOOD PRESSURE SUPPORT)
PREFILLED_SYRINGE | INTRAVENOUS | Status: DC | PRN
  Administered 2021-01-07 (×2): 120 ug via INTRAVENOUS
  Administered 2021-01-07: 80 ug via INTRAVENOUS

## 2021-01-07 MED ORDER — SODIUM CHLORIDE 0.9% FLUSH
3.0000 mL | Freq: Two times a day (BID) | INTRAVENOUS | Status: DC
  Administered 2021-01-07: 3 mL via INTRAVENOUS

## 2021-01-07 MED ORDER — ACETAMINOPHEN 325 MG PO TABS
650.0000 mg | ORAL_TABLET | ORAL | Status: DC | PRN
  Administered 2021-01-07: 650 mg via ORAL
  Filled 2021-01-07: qty 2

## 2021-01-07 MED ORDER — MIDAZOLAM HCL 2 MG/2ML IJ SOLN
INTRAMUSCULAR | Status: AC
  Filled 2021-01-07: qty 2

## 2021-01-07 MED ORDER — ONDANSETRON HCL 4 MG/2ML IJ SOLN
INTRAMUSCULAR | Status: DC | PRN
  Administered 2021-01-07: 4 mg via INTRAVENOUS

## 2021-01-07 MED ORDER — LEVOTHYROXINE SODIUM 25 MCG PO TABS
125.0000 ug | ORAL_TABLET | Freq: Every day | ORAL | Status: DC
  Administered 2021-01-08: 125 ug via ORAL
  Filled 2021-01-07: qty 1

## 2021-01-07 MED ORDER — ONDANSETRON HCL 4 MG/2ML IJ SOLN: 4.0000 mg | Freq: Four times a day (QID) | INTRAMUSCULAR | Status: DC | PRN

## 2021-01-07 MED ORDER — AMISULPRIDE (ANTIEMETIC) 5 MG/2ML IV SOLN: 10.0000 mg | Freq: Once | INTRAVENOUS | Status: DC | PRN

## 2021-01-07 MED ORDER — PROPOFOL 10 MG/ML IV BOLUS
INTRAVENOUS | Status: AC
  Filled 2021-01-07: qty 20

## 2021-01-07 MED ORDER — ACETAMINOPHEN 325 MG PO TABS: 325.0000 mg | ORAL_TABLET | Freq: Once | ORAL | Status: DC | PRN

## 2021-01-07 MED ORDER — ROCURONIUM BROMIDE 10 MG/ML (PF) SYRINGE
PREFILLED_SYRINGE | INTRAVENOUS | Status: DC | PRN
  Administered 2021-01-07: 70 mg via INTRAVENOUS

## 2021-01-07 MED ORDER — MIDAZOLAM HCL 5 MG/5ML IJ SOLN
INTRAMUSCULAR | Status: DC | PRN
  Administered 2021-01-07: 2 mg via INTRAVENOUS

## 2021-01-07 MED ORDER — LIDOCAINE 2% (20 MG/ML) 5 ML SYRINGE
INTRAMUSCULAR | Status: DC | PRN
  Administered 2021-01-07: 100 mg via INTRAVENOUS

## 2021-01-07 MED ORDER — ONDANSETRON HCL 4 MG/2ML IJ SOLN
INTRAMUSCULAR | Status: AC
  Filled 2021-01-07: qty 2

## 2021-01-07 MED ORDER — BUPIVACAINE HCL (PF) 0.25 % IJ SOLN
INTRAMUSCULAR | Status: AC
  Filled 2021-01-07: qty 30

## 2021-01-07 MED ORDER — MENTHOL 3 MG MT LOZG: 1.0000 | LOZENGE | OROMUCOSAL | Status: DC | PRN

## 2021-01-07 MED ORDER — METHOCARBAMOL 500 MG PO TABS
500.0000 mg | ORAL_TABLET | Freq: Four times a day (QID) | ORAL | Status: DC | PRN
  Administered 2021-01-07 – 2021-01-08 (×2): 500 mg via ORAL
  Filled 2021-01-07 (×2): qty 1

## 2021-01-07 SURGICAL SUPPLY — 52 items
AGENT HMST KT MTR STRL THRMB (HEMOSTASIS) ×1
BUR ROUND FLUTED 4 SOFT TCH (BURR) IMPLANT
BUR ROUND FLUTED 4MM SOFT TCH (BURR)
CANISTER SUCT 3000ML PPV (MISCELLANEOUS) ×3 IMPLANT
CLOSURE STERI-STRIP 1/2X4 (GAUZE/BANDAGES/DRESSINGS) ×1
CLSR STERI-STRIP ANTIMIC 1/2X4 (GAUZE/BANDAGES/DRESSINGS) ×2 IMPLANT
COVER SURGICAL LIGHT HANDLE (MISCELLANEOUS) ×3 IMPLANT
COVER WAND RF STERILE (DRAPES) ×3 IMPLANT
DECANTER SPIKE VIAL GLASS SM (MISCELLANEOUS) ×1 IMPLANT
DRAPE HALF SHEET 40X57 (DRAPES) ×6 IMPLANT
DRAPE MICROSCOPE LEICA (MISCELLANEOUS) ×3 IMPLANT
DRAPE SURG 17X23 STRL (DRAPES) ×3 IMPLANT
DRSG MEPILEX BORDER 4X4 (GAUZE/BANDAGES/DRESSINGS) ×3 IMPLANT
DRSG MEPILEX BORDER 4X8 (GAUZE/BANDAGES/DRESSINGS) ×2 IMPLANT
DURAPREP 26ML APPLICATOR (WOUND CARE) ×3 IMPLANT
ELECT BLADE 4.0 EZ CLEAN MEGAD (MISCELLANEOUS) ×3
ELECT REM PT RETURN 9FT ADLT (ELECTROSURGICAL) ×3
ELECTRODE BLDE 4.0 EZ CLN MEGD (MISCELLANEOUS) IMPLANT
ELECTRODE REM PT RTRN 9FT ADLT (ELECTROSURGICAL) ×1 IMPLANT
GLOVE ORTHO TXT STRL SZ7.5 (GLOVE) ×6 IMPLANT
GLOVE SRG 8 PF TXTR STRL LF DI (GLOVE) ×2 IMPLANT
GLOVE SURG UNDER POLY LF SZ8 (GLOVE) ×6
GOWN STRL REUS W/ TWL LRG LVL3 (GOWN DISPOSABLE) ×2 IMPLANT
GOWN STRL REUS W/ TWL XL LVL3 (GOWN DISPOSABLE) ×1 IMPLANT
GOWN STRL REUS W/TWL 2XL LVL3 (GOWN DISPOSABLE) ×3 IMPLANT
GOWN STRL REUS W/TWL LRG LVL3 (GOWN DISPOSABLE) ×6
GOWN STRL REUS W/TWL XL LVL3 (GOWN DISPOSABLE) ×3
KIT BASIN OR (CUSTOM PROCEDURE TRAY) ×3 IMPLANT
KIT TURNOVER KIT B (KITS) ×3 IMPLANT
MANIFOLD NEPTUNE II (INSTRUMENTS) ×1 IMPLANT
NDL HYPO 25GX1X1/2 BEV (NEEDLE) ×1 IMPLANT
NDL SPNL 18GX3.5 QUINCKE PK (NEEDLE) ×1 IMPLANT
NEEDLE HYPO 25GX1X1/2 BEV (NEEDLE) ×3 IMPLANT
NEEDLE SPNL 18GX3.5 QUINCKE PK (NEEDLE) ×3 IMPLANT
NS IRRIG 1000ML POUR BTL (IV SOLUTION) ×3 IMPLANT
PACK LAMINECTOMY ORTHO (CUSTOM PROCEDURE TRAY) ×3 IMPLANT
PAD ARMBOARD 7.5X6 YLW CONV (MISCELLANEOUS) ×6 IMPLANT
PATTIES SURGICAL .5 X.5 (GAUZE/BANDAGES/DRESSINGS) ×2 IMPLANT
PATTIES SURGICAL .75X.75 (GAUZE/BANDAGES/DRESSINGS) IMPLANT
SPONGE LAP 4X18 RFD (DISPOSABLE) ×2 IMPLANT
STAPLER VISISTAT 35W (STAPLE) ×2 IMPLANT
SURGIFLO W/THROMBIN 8M KIT (HEMOSTASIS) ×2 IMPLANT
SUT VIC AB 0 CT1 27 (SUTURE) ×3
SUT VIC AB 0 CT1 27XBRD ANBCTR (SUTURE) IMPLANT
SUT VIC AB 1 CTX 36 (SUTURE) ×3
SUT VIC AB 1 CTX36XBRD ANBCTR (SUTURE) ×1 IMPLANT
SUT VIC AB 2-0 CT1 27 (SUTURE) ×3
SUT VIC AB 2-0 CT1 TAPERPNT 27 (SUTURE) ×1 IMPLANT
SUT VIC AB 3-0 X1 27 (SUTURE) ×3 IMPLANT
SYR BULB IRRIG 60ML STRL (SYRINGE) ×2 IMPLANT
TOWEL GREEN STERILE (TOWEL DISPOSABLE) ×3 IMPLANT
TOWEL GREEN STERILE FF (TOWEL DISPOSABLE) ×3 IMPLANT

## 2021-01-07 NOTE — Anesthesia Procedure Notes (Signed)
Procedure Name: Intubation Date/Time: 01/07/2021 1:45 PM Performed by: Harden Mo, CRNA Pre-anesthesia Checklist: Patient identified, Emergency Drugs available, Suction available and Patient being monitored Patient Re-evaluated:Patient Re-evaluated prior to induction Oxygen Delivery Method: Circle System Utilized Preoxygenation: Pre-oxygenation with 100% oxygen Induction Type: IV induction Ventilation: Mask ventilation without difficulty and Oral airway inserted - appropriate to patient size Laryngoscope Size: Mac and 4 Grade View: Grade I Tube type: Oral Number of attempts: 1 Airway Equipment and Method: Stylet and Oral airway Placement Confirmation: ETT inserted through vocal cords under direct vision,  positive ETCO2 and breath sounds checked- equal and bilateral Secured at: 23 cm Tube secured with: Tape Dental Injury: Teeth and Oropharynx as per pre-operative assessment  Comments: Atraumatic oral intubation by Dorene Grebe, SRNA.

## 2021-01-07 NOTE — Discharge Instructions (Addendum)
Ok to shower 3 days postop.  No tub soaking. Do not apply any creams or ointments to incision.  Do not remove steri-strips.  Can use 4x4 gauze and tape for dressing changes.  No aggressive activity.  No bending, twisting, squatting or prolonged sitting.  Mostly be in reclined position or lying down.    No driving.

## 2021-01-07 NOTE — Anesthesia Postprocedure Evaluation (Signed)
Anesthesia Post Note  Patient: Cory Moore  Procedure(s) Performed: Lumbar three-lumbar four DECOMPRESSION  and  LEFT MICRODISCECTOMY (N/A Spine Lumbar)     Patient location during evaluation: PACU Anesthesia Type: General Level of consciousness: awake and alert Pain management: pain level controlled Vital Signs Assessment: post-procedure vital signs reviewed and stable Respiratory status: spontaneous breathing, nonlabored ventilation, respiratory function stable and patient connected to nasal cannula oxygen Cardiovascular status: blood pressure returned to baseline and stable Postop Assessment: no apparent nausea or vomiting Anesthetic complications: no   No complications documented.  Last Vitals:  Vitals:   01/07/21 1629 01/07/21 1648  BP: 123/90 (!) 146/93  Pulse: (!) 55 (!) 56  Resp:  18  Temp: (!) 36.3 C 36.5 C  SpO2: 98% 100%    Last Pain:  Vitals:   01/07/21 1650  TempSrc:   PainSc: 0-No pain                 Catalina Gravel

## 2021-01-07 NOTE — H&P (Signed)
MRN: [id number] Visit Date: 01/05/2021                                                                     Requested by: Marin Olp, MD [place],  [place] [address] PCP: Marin Olp, MD   Assessment & Plan: Visit Diagnoses:  1. Foot drop, left   2. Spinal stenosis of lumbar region with neurogenic claudication     Plan: Plan to be single level lumbar decompression at L3-4 with microdiscectomy left side for his acute severe central stenosis with acute left foot drop. Patient has small disc protrusion at L4-5 but no compression neither right or left side and no significant central compression at that level. We discussed single level decompression with microdiscectomy. We discussed risk of cauda equina syndrome developing since he is narrowed down to the 5 to 6 mm range at L3-4. Plan would be decompression overnight stay. He understands that he may have only partial improvement in the foot drop for period of time or may get immediate improvement. Risks of dural tear anesthetic risk discussed. Questions elicited and answered he understands request to proceed.  Follow-Up Instructions: No follow-ups on file.   Orders:  No orders of the defined types were placed in this encounter.  No orders of the defined types were placed in this encounter.     Procedures: No procedures performed   Clinical Data: No additional findings.   Subjective:    Chief Complaint  Patient presents with  . Lower Back - Pain    HPI 60 year old male normally enjoys hiking with his wife is seen with 5-day history of severe excruciating back and left leg pain with weakness. He been followed by Dr. Junius Roads for several months treated with exercise program baclofen, prednisone for back pain and left leg pain. Patient presented to the emergency room because he could not stand had trouble walking. He is having increased back pain after 12/1620 and states prednisone helped him  and then when he was moving better he went to a hockey game in [place] when he came back he noticed he had foot drop on the left and could not stand up. Emergency room he was given crutches. He states his leg gives way does have the crutches and he will fall. Increased pain with standing and also attempts at walking more than a few steps. Patient's general health has been good he had hernia repair with mesh in his 23s appendectomy in his 44s. He was given oxycodone for pain he finished the prednisone Dosepak still has a baclofen.  Emergent MRI done emergency room showed some tiny disc bulges in the thoracic spine without compression. Severe stenosis L3-4 with large central disc protrusion. Narrowing down less than 6 mm centrally where other levels are at 15 mm. Patient had ligamentous thickening at L3-4 with severe central stenosis.  Review of Systems all other systems are negative no history of heart trouble has been active likes to kayak hike.   Objective: Vital Signs: BP 131/76   Pulse 76   Ht 6\' 4"  (1.93 m)   Wt 265 lb (120.2 kg)   BMI 32.26 kg/m   Physical Exam Constitutional:      Appearance: He is well-developed and  well-nourished.  HENT:     Head: Normocephalic and atraumatic.  Eyes:     Extraocular Movements: EOM normal.     Pupils: Pupils are equal, round, and reactive to light.  Neck:     Thyroid: No thyromegaly.     Trachea: No tracheal deviation.  Cardiovascular:     Rate and Rhythm: Normal rate.  Pulmonary:     Effort: Pulmonary effort is normal.     Breath sounds: No wheezing.  Abdominal:     General: Bowel sounds are normal.     Palpations: Abdomen is soft.  Skin:    General: Skin is warm and dry.     Capillary Refill: Capillary refill takes less than 2 seconds.  Neurological:     Mental Status: He is alert and oriented to person, place, and time.  Psychiatric:        Mood and Affect: Mood and affect normal.        Behavior: Behavior normal.         Thought Content: Thought content normal.        Judgment: Judgment normal.     Ortho Exam positive straight leg raising on the left at 50 degrees. Patient has foot drop with 3+ out of 5 ankle dorsiflexion on the left EHL is 4- out of 5. Opposite right side is strong. Plantarflexion is strong. No saddle anesthesia. Skin over the lumbar region is normal pedal pulses are normal.  Specialty Comments:  No specialty comments available.  Imaging: CLINICAL DATA: Onset left foot drop last night. Left leg tingling for several days.  EXAM: MRI THORACIC AND LUMBAR SPINE WITHOUT CONTRAST  TECHNIQUE: Multiplanar and multiecho pulse sequences of the thoracic and lumbar spine were obtained without intravenous contrast.  COMPARISON: None.  FINDINGS: MRI THORACIC SPINE FINDINGS  Alignment: Normal.  Vertebrae: No fracture or worrisome lesion. Hemangioma in T7 incidentally noted. A few small Schmorl's nodes are seen in the lower thoracic spine.  Cord: Normal signal throughout.  Paraspinal and other soft tissues: Negative.  Disc levels:  T3-4: Very shallow left paracentral protrusion without stenosis.  T6-7: Shallow right paracentral protrusion with cephalad extension. No stenosis.  T8-9: Shallow right paracentral protrusion without stenosis.  Except as described, intervertebral disc spaces are negative.  MRI LUMBAR SPINE FINDINGS  Segmentation: Standard.  Alignment: Normal.  Vertebrae: No fracture, evidence of discitis, or bone lesion.  Conus medullaris and cauda equina: Conus extends to the L1 level. Conus and cauda equina appear normal.  Paraspinal and other soft tissues: Parapelvic left renal cyst versus prominent renal pelvis noted.  Disc levels:  L1-2: Negative.  L2-3: Negative.  L3-4: The patient has a large broad-based central disc protrusion. There is also some ligamentum flavum thickening and mild to moderate facet  arthropathy. Severe central canal and bilateral subarticular recess narrowing is present. Protruding disc just beyond the right foramen contacts the right L3 root without compression or displacement.  L4-5: Shallow central protrusion without stenosis.  L5-S1: Negative.  IMPRESSION: MR THORACIC SPINE IMPRESSION  Mild thoracic degenerative change without stenosis.  MR LUMBAR SPINE IMPRESSION  Severe central canal and bilateral subarticular recess narrowing at L3-4 where there is a large broad-based central protrusion, ligamentum flavum thickening and mild to moderate facet degenerative disease. Protruding disc in the far periphery of the right foramen at L3-4 contacts the L3 root without compression or displacement.   Electronically Signed By: Inge Rise M.D. On: 01/03/2021 15:24    PMFS History: Patient Active Problem List  Diagnosis Date Noted  . Foot drop, left 01/05/2021  . Spinal stenosis of lumbar region 01/05/2021  . B12 deficiency 09/10/2019  . History of adenomatous polyp of colon 03/24/2018  . Former smoker 12/12/2012  . ERECTILE DYSFUNCTION 10/03/2008  . Hypothyroidism 10/02/2007  . Hyperlipidemia 10/02/2007       Past Medical History:  Diagnosis Date  . Anxiety    a lot of stressors. panic attacks in younger years.  . ED (erectile dysfunction)   . GERD (gastroesophageal reflux disease)    very mild  . Hyperlipidemia   . Thyroid disease   . Tobacco abuse          Family History  Problem Relation Age of Onset  . Hypertension Mother   . Hyperlipidemia Mother   . Colon polyps Mother   . Hypertension Father   . Hyperlipidemia Father   . Hyperlipidemia Sister   . Hyperlipidemia Brother   . Colon cancer Neg Hx   . Rectal cancer Neg Hx   . Stomach cancer Neg Hx   . Esophageal cancer Neg Hx          Past Surgical History:  Procedure Laterality Date  . APPENDECTOMY  1998  . COLONOSCOPY    . HERNIA  REPAIR  2003   right inguinal  . POLYPECTOMY     Social History        Occupational History  . Not on file  Tobacco Use  . Smoking status: Former Smoker    Packs/day: 1.00    Years: 25.00    Pack years: 25.00    Types: E-cigarettes    Quit date: 11/15/2002    Years since quitting: 18.1  . Smokeless tobacco: Never Used  . Tobacco comment: but still on e cigs daily  Substance and Sexual Activity  . Alcohol use: Yes    Alcohol/week: 2.0 standard drinks    Types: 1 Glasses of wine, 1 Shots of liquor per week    Comment: socially - 1-2 a week   . Drug use: No  . Sexual activity: Not on file

## 2021-01-07 NOTE — Transfer of Care (Signed)
Immediate Anesthesia Transfer of Care Note  Patient: Cory Moore  Procedure(s) Performed: Lumbar three-lumbar four DECOMPRESSION  and  LEFT MICRODISCECTOMY (N/A Spine Lumbar)  Patient Location: PACU  Anesthesia Type:General  Level of Consciousness: drowsy and responds to stimulation  Airway & Oxygen Therapy: Patient Spontanous Breathing and Patient connected to face mask oxygen  Post-op Assessment: Report given to RN and Post -op Vital signs reviewed and stable  Post vital signs: Reviewed and stable  Last Vitals:  Vitals Value Taken Time  BP 119/82 01/07/21 1528  Temp    Pulse 65 01/07/21 1533  Resp 17 01/07/21 1533  SpO2 100 % 01/07/21 1533  Vitals shown include unvalidated device data.  Last Pain:  Vitals:   01/07/21 1056  TempSrc:   PainSc: 0-No pain         Complications: No complications documented.

## 2021-01-07 NOTE — Interval H&P Note (Signed)
History and Physical Interval Note:  01/07/2021 1:02 PM  Cory Moore  has presented today for surgery, with the diagnosis of L3-4, central herniated nucleus pulposus, stenosis, left foot drop.  The various methods of treatment have been discussed with the patient and family. After consideration of risks, benefits and other options for treatment, the patient has consented to  Procedure(s): L3-4 DECOMPRESSION  and  LEFT MICRODISCECTOMY (N/A) as a surgical intervention.  The patient's history has been reviewed, patient examined, no change in status, stable for surgery.  I have reviewed the patient's chart and labs.  Questions were answered to the patient's satisfaction.     Cory Moore

## 2021-01-07 NOTE — Anesthesia Preprocedure Evaluation (Addendum)
Anesthesia Evaluation  Patient identified by MRN, date of birth, ID band Patient awake    Reviewed: Allergy & Precautions, NPO status , Patient's Chart, lab work & pertinent test results  Airway Mallampati: III  TM Distance: >3 FB Neck ROM: Full    Dental  (+) Teeth Intact, Dental Advisory Given   Pulmonary former smoker,    breath sounds clear to auscultation       Cardiovascular negative cardio ROS   Rhythm:Regular Rate:Normal     Neuro/Psych Anxiety negative neurological ROS     GI/Hepatic Neg liver ROS, GERD  ,  Endo/Other  Hypothyroidism   Renal/GU negative Renal ROS     Musculoskeletal negative musculoskeletal ROS (+)   Abdominal Normal abdominal exam  (+)   Peds  Hematology negative hematology ROS (+)   Anesthesia Other Findings   Reproductive/Obstetrics                            Anesthesia Physical Anesthesia Plan  ASA: II  Anesthesia Plan: General   Post-op Pain Management:    Induction: Intravenous  PONV Risk Score and Plan: 3 and Ondansetron, Dexamethasone and Midazolam  Airway Management Planned: Oral ETT  Additional Equipment: None  Intra-op Plan:   Post-operative Plan: Extubation in OR  Informed Consent: I have reviewed the patients History and Physical, chart, labs and discussed the procedure including the risks, benefits and alternatives for the proposed anesthesia with the patient or authorized representative who has indicated his/her understanding and acceptance.     Dental advisory given  Plan Discussed with: CRNA  Anesthesia Plan Comments:        Anesthesia Quick Evaluation

## 2021-01-08 ENCOUNTER — Encounter (HOSPITAL_COMMUNITY): Payer: Self-pay | Admitting: Orthopaedic Surgery

## 2021-01-08 DIAGNOSIS — M5126 Other intervertebral disc displacement, lumbar region: Secondary | ICD-10-CM

## 2021-01-08 DIAGNOSIS — M48062 Spinal stenosis, lumbar region with neurogenic claudication: Secondary | ICD-10-CM | POA: Diagnosis not present

## 2021-01-08 MED ORDER — OXYCODONE-ACETAMINOPHEN 5-325 MG PO TABS: 1.0000 | ORAL_TABLET | ORAL | 0 refills | Status: DC | PRN

## 2021-01-08 MED ORDER — METHOCARBAMOL 500 MG PO TABS: 500.0000 mg | ORAL_TABLET | Freq: Four times a day (QID) | ORAL | 0 refills | Status: DC | PRN

## 2021-01-08 NOTE — Evaluation (Signed)
Physical Therapy Evaluation and Discharge Patient Details Name: Cory Moore MRN: [id number] DOB: Apr 18, 1961 Today's Date: 01/08/2021   History of Present Illness  60 y/o M with several months of lower back an hip pain. X-rays reveal moderate L5-S1 degenerative disc disease.  Underwent L3-4 DECOMPRESSION  and  LEFT MICRODISCECTOMY.  \  Clinical Impression  Patient evaluated by Physical Therapy with no further acute PT needs identified. All education has been completed and the patient has no further questions. Pt was able to demonstrate transfers and ambulation with gross modified independence with no AD. Pt was educated on precautions, positioning recommendations, appropriate activity progression, and car transfer. See below for any follow-up Physical Therapy or equipment needs. PT is signing off. Thank you for this referral.     Follow Up Recommendations No PT follow up;Supervision - Intermittent    Equipment Recommendations  None recommended by PT    Recommendations for Other Services       Precautions / Restrictions Precautions Precautions: Back Precaution Booklet Issued: Yes (comment) Restrictions Weight Bearing Restrictions: No      Mobility  Bed Mobility Overal bed mobility: Modified Independent             General bed mobility comments: Pt was received exiting bathroom in room.    Transfers Overall transfer level: Modified independent Equipment used: None             General transfer comment: Good hand placement on seated surface for safety. No assist required and good control in LE's to lower down to the bed.  Ambulation/Gait Ambulation/Gait assistance: Modified independent (Device/Increase time) Gait Distance (Feet): 300 Feet Assistive device: None Gait Pattern/deviations: Step-through pattern;Decreased stride length;Trunk flexed Gait velocity: Decreased Gait velocity interpretation: 1.31 - 2.62 ft/sec, indicative of limited community  ambulator General Gait Details: Pt with good posture overall. 1 small misstep/trip without need for assist to recover. Pt states the grips on his socks caught the floor wrong. No other losses of balance noted.  Stairs Stairs: Yes Stairs assistance: Supervision Stair Management: Step to pattern;Alternating pattern;One rail Right;Forwards Number of Stairs: 10 General stair comments: VC's for sequencing and general safety. No assist required.  Wheelchair Mobility    Modified Rankin (Stroke Patients Only)       Balance Overall balance assessment: Mild deficits observed, not formally tested                                           Pertinent Vitals/Pain Pain Assessment: Faces Pain Score: 5  Faces Pain Scale: Hurts a little bit Pain Location: incisional pain Pain Descriptors / Indicators: Tender;Operative site guarding;Sore Pain Intervention(s): Limited activity within patient's tolerance;Monitored during session;Repositioned    Home Living Family/patient expects to be discharged to:: Private residence Living Arrangements: Spouse/significant other Available Help at Discharge: Available 24 hours/day Type of Home: House Home Access: Stairs to enter Entrance Stairs-Rails: Right Entrance Stairs-Number of Steps: 5 Home Layout: Two level;Bed/bath upstairs Home Equipment: Crutches;Shower seat      Prior Function Level of Independence: Independent               Hand Dominance   Dominant Hand: Right    Extremity/Trunk Assessment   Upper Extremity Assessment Upper Extremity Assessment: Defer to OT evaluation    Lower Extremity Assessment Lower Extremity Assessment: Overall WFL for tasks assessed    Cervical / Trunk Assessment Cervical / Trunk  Assessment: Other exceptions Cervical / Trunk Exceptions: s/p spine surgery  Communication   Communication: No difficulties  Cognition Arousal/Alertness: Awake/alert Behavior During Therapy: WFL for tasks  assessed/performed Overall Cognitive Status: Within Functional Limits for tasks assessed                                        General Comments General comments (skin integrity, edema, etc.): continues with weakness to LLE, but stronger than baseline.    Exercises     Assessment/Plan    PT Assessment Patent does not need any further PT services  PT Problem List         PT Treatment Interventions      PT Goals (Current goals can be found in the Care Plan section)  Acute Rehab PT Goals Patient Stated Goal: Get back to hiking PT Goal Formulation: All assessment and education complete, DC therapy    Frequency     Barriers to discharge        Co-evaluation               AM-PAC PT "6 Clicks" Mobility  Outcome Measure Help needed turning from your back to your side while in a flat bed without using bedrails?: None Help needed moving from lying on your back to sitting on the side of a flat bed without using bedrails?: None Help needed moving to and from a bed to a chair (including a wheelchair)?: None Help needed standing up from a chair using your arms (e.g., wheelchair or bedside chair)?: None Help needed to walk in hospital room?: None Help needed climbing 3-5 steps with a railing? : None 6 Click Score: 24    End of Session Equipment Utilized During Treatment: Gait belt Activity Tolerance: Patient tolerated treatment well Patient left: in bed;with call bell/phone within reach Nurse Communication: Mobility status PT Visit Diagnosis: Unsteadiness on feet (R26.81);Pain Pain - part of body:  (back)    Time: 3329-5188 PT Time Calculation (min) (ACUTE ONLY): 15 min   Charges:   PT Evaluation $PT Eval Low Complexity: 1 Low          Rolinda Roan, PT, DPT Acute Rehabilitation Services Pager: [phone] Office: [phone]   Thelma Comp 01/08/2021, 11:38 AM

## 2021-01-08 NOTE — Progress Notes (Signed)
Subjective: Doing well.  preop leg pain much improved.  Ready to d/c home.    Objective: Vital signs in last 24 hours: Temp:  [97.1 F (36.2 C)-98.1 F (36.7 C)] 97.6 F (36.4 C) (02/24 1139) Pulse Rate:  [55-65] 58 (02/24 1139) Resp:  [12-20] 17 (02/24 1139) BP: (106-146)/(62-93) 109/68 (02/24 1139) SpO2:  [95 %-100 %] 99 % (02/24 1139)  Intake/Output from previous day: 02/23 0701 - 02/24 0700 In: 1210 [P.O.:360; I.V.:800; IV Piggyback:50] Out: 251 [Urine:1; Blood:250] Intake/Output this shift: No intake/output data recorded.  Recent Labs    01/07/21 1022  HGB 15.9   Recent Labs    01/07/21 1022  WBC 6.7  RBC 5.02  HCT 44.2  PLT 236   Recent Labs    01/07/21 1022  NA 136  K 3.7  CL 102  CO2 20*  BUN 19  CREATININE 0.92  GLUCOSE 100*  CALCIUM 9.2   No results for input(s): LABPT, INR in the last 72 hours.  Exam: Wound looks good.  Staples intact.  No drainage or signs of infection.  Neurologically intact.     Assessment/Plan: D/c home today.  Percocet and robaxin sent to pharmacy.  F/u one weeks.       Benjiman Core 01/08/2021, 12:27 PM

## 2021-01-08 NOTE — Op Note (Addendum)
Preop diagnosis: L3-4 lumbar spinal stenosis with claudication and disc protrusion with left foot drop.  Postop diagnosis: Same  Procedure: L3-4 single level lumbar decompression with left microdiscectomy.( bilateral hemilaminectomy with microdiscectomy due to large disc protrusion )  Surgeon: Rodell Perna, MD  Assistant: Benjiman Core, PA-C medically necessary and present for the entire procedure  EBL 250 cc  Anesthesia General plus Marcaine skin local.  Findings: Multifactorial stenosis L3-4 with large disc protrusion consistent with preoperative MRI findings.  Procedure: After induction of general anesthesia orotracheal ovation prone positioning back was prepped with DuraPrep and securing the areas with towel sterile skin marker Betadine Steri-Drape laminectomy sheets and drapes.  Timeout procedure completed Ancef prophylaxis crosstable lateral with a spinal needle placed above and below the L3-4 level based on palpable landmarks for planned decompression.  With level confirmed and timeout procedure incision was made the midline subperiosteal dissection was performed and then Kocher clamps were placed above and below the area of planned decompression confirmed with a second image bones was permanently marked and then midline exposure was made remove the spinous process at L3 top portion of L4.  Operative microscope was brought in thick chunks of ligament were removed.  Patient had about 4 mm AP diameter with severe compression.  Overhanging spurs were removed which were minimal but hypertrophic ligamentum was removed which was principally causing the stenosis with addition of the very large central disc protrusion.  Once decompression was performed in the gutters with careful protection of the dura with patties Surgi-Flo was used in the gutters as well as one half by half patties.  On the left side disc was exposed changes incised and using to recover to protect the dura microdiscectomy was performed  with straight and up-biting micropituitary.  Barbra Sarks was placed underneath the dura pushed down and a large amount of disc was extruded out through the annular opening to be made with a 15 scalpel blade.  Chunks of disc removed for decompression until the disc anteriorly was flat.  Dural tube was reinspected copious irrigation.  Bipolar cautery was used for veins in the lateral gutter.  Dura was intact no remaining compression.  Fascia was closed with #1 Vicryl 2-0 Vicryl subtenons tissue skin staple closure postop dressing transferred to care room.  In recovery room patient had significant improvement in his EHL and ankle dorsiflexion still mildly weak but significantly improved from preop with improvement return to normal sensation dorsum of the foot.

## 2021-01-08 NOTE — Progress Notes (Signed)
Pt doing well. Pt and wife given D/C instructions with verbal understanding. Rx's were sent to the pharmacy by MD. Pt's incision is clean and dry with no sign of infection. Pt's IV was removed prior to D/C. Dressing was changed per MD order. Pt D/C'd home via wheelchair per MD order. Pt is stable @ D/C and has no other needs at this time. Holli Humbles, RN

## 2021-01-08 NOTE — Evaluation (Signed)
Occupational Therapy Evaluation Patient Details Name: Cory Moore MRN: [id number] DOB: May 29, 1961 Today's Date: 01/08/2021    History of Present Illness 60yo M with several months of lower back an hip pain. X-rays reveal moderate L5-S1 degenerative disc disease.  Underwent L3-4 DECOMPRESSION  and  LEFT MICRODISCECTOMY.   Clinical Impression   Patient admitted for the diagnosis and procedure above.  Overall he is subjectively feeling stronger and is completing basic ADL and mobility better than last week, per patient.  He is planning on d/c home today, and no OT needs in acute or post acute.  All questions answered and good understanding of education and precautions.      Follow Up Recommendations  No OT follow up    Equipment Recommendations  None recommended by OT    Recommendations for Other Services       Precautions / Restrictions Precautions Precautions: Back Precaution Booklet Issued: Yes (comment) Restrictions Weight Bearing Restrictions: No      Mobility Bed Mobility Overal bed mobility: Modified Independent                  Transfers Overall transfer level: Modified independent Equipment used: None                  Balance Overall balance assessment: Mild deficits observed, not formally tested                                         ADL either performed or assessed with clinical judgement   ADL Overall ADL's : Modified independent                                       General ADL Comments: patient able to sit edge of bed and dangle pants for lower body dressing, and bring feet to figure four for socks without assist.  up and walking in room without assist.     Vision Patient Visual Report: No change from baseline       Perception     Praxis      Pertinent Vitals/Pain Pain Assessment: 0-10 Pain Score: 5  Pain Location: incisional pain Pain Descriptors / Indicators: Aching;Tender;Grimacing Pain  Intervention(s): Monitored during session     Hand Dominance Right   Extremity/Trunk Assessment Upper Extremity Assessment Upper Extremity Assessment: Overall WFL for tasks assessed   Lower Extremity Assessment Lower Extremity Assessment: Defer to PT evaluation   Cervical / Trunk Assessment Cervical / Trunk Assessment: Normal   Communication Communication Communication: No difficulties   Cognition Arousal/Alertness: Awake/alert Behavior During Therapy: WFL for tasks assessed/performed Overall Cognitive Status: Within Functional Limits for tasks assessed                                     General Comments  continues with weakness to LLE, but stronger than baseline.               Home Living Family/patient expects to be discharged to:: Private residence Living Arrangements: Spouse/significant other Available Help at Discharge: Available 24 hours/day Type of Home: House Home Access: Stairs to enter [employer] of Steps: 5 Entrance Stairs-Rails: Right Home Layout: Two level;Bed/bath upstairs Alternate Level Stairs-Number of Steps: 12   Bathroom Shower/Tub:  Walk-in shower   Bathroom Toilet: Standard Bathroom Accessibility: Yes How Accessible: Accessible via walker Home Equipment: Crutches;Shower seat          Prior Functioning/Environment Level of Independence: Independent                 OT Problem List: Impaired balance (sitting and/or standing)      OT Treatment/Interventions:      OT Goals(Current goals can be found in the care plan section) Acute Rehab OT Goals Patient Stated Goal: Get back to hiking OT Goal Formulation: With patient Time For Goal Achievement: 01/08/21 Potential to Achieve Goals: Good  OT Frequency:     Barriers to D/C:            Co-evaluation              AM-PAC OT "6 Clicks" Daily Activity     Outcome Measure Help from another person eating meals?: None Help from another person  taking care of personal grooming?: None Help from another person toileting, which includes using toliet, bedpan, or urinal?: None Help from another person bathing (including washing, rinsing, drying)?: None Help from another person to put on and taking off regular upper body clothing?: None Help from another person to put on and taking off regular lower body clothing?: None 6 Click Score: 24   End of Session Nurse Communication: Mobility status  Activity Tolerance: Patient tolerated treatment well Patient left: in bed;with call bell/phone within reach  OT Visit Diagnosis: Unsteadiness on feet (R26.81)                Time: 5859-2924 OT Time Calculation (min): 24 min Charges:  OT General Charges $OT Visit: 1 Visit OT Evaluation $OT Eval Moderate Complexity: 1 Mod OT Treatments $Self Care/Home Management : 8-22 mins  01/08/2021  Rich, OTR/L  Acute Rehabilitation Services  Office:  [phone]   Metta Clines 01/08/2021, 9:10 AM

## 2021-01-09 DIAGNOSIS — M5126 Other intervertebral disc displacement, lumbar region: Secondary | ICD-10-CM | POA: Diagnosis present

## 2021-01-14 ENCOUNTER — Ambulatory Visit (INDEPENDENT_AMBULATORY_CARE_PROVIDER_SITE_OTHER): Payer: 59 | Admitting: Orthopaedic Surgery

## 2021-01-14 ENCOUNTER — Encounter: Payer: Self-pay | Admitting: Orthopaedic Surgery

## 2021-01-14 VITALS — BP 132/83 | HR 64 | Ht 76.0 in | Wt 265.0 lb

## 2021-01-14 DIAGNOSIS — M48062 Spinal stenosis, lumbar region with neurogenic claudication: Secondary | ICD-10-CM

## 2021-01-14 NOTE — Progress Notes (Signed)
Post-Op Visit Note   Patient: Cory Moore           Date of Birth: 12/20/1960           MRN: [id number] Visit Date: 01/14/2021 PCP: Marin Olp, MD   Assessment & Plan: Follow-up health 3-4 decompression left microdiscectomy.  This is bilateral hemilaminectomy.  Incision looks good staples are in place.  New dressing applied with Band-Aids which he can change after showering.  Return 1 week for staple removal.  He is off his pain medication.  He is using ibuprofen currently.  He is doing some work on his computer from home limiting any sitting.  Chief Complaint:  Chief Complaint  Patient presents with  . Lower Back - Routine Post Op    01/07/2021 L3-4 Decompression, Left Microdiscectomy   Visit Diagnoses:  1. Spinal stenosis of lumbar region with neurogenic claudication     Plan: Return in 1 week for staple removal.  Activities discussed.  Follow-Up Instructions: Return in about 1 week (around 01/21/2021).   Orders:  No orders of the defined types were placed in this encounter.  No orders of the defined types were placed in this encounter.   Imaging: No results found.  PMFS History: Patient Active Problem List   Diagnosis Date Noted  . L 01/09/2021  . Lumbar stenosis 01/07/2021  . Foot drop, left 01/05/2021  . Spinal stenosis of lumbar region 01/05/2021  . B12 deficiency 09/10/2019  . History of adenomatous polyp of colon 03/24/2018  . Former smoker 12/12/2012  . ERECTILE DYSFUNCTION 10/03/2008  . Hypothyroidism 10/02/2007  . Hyperlipidemia 10/02/2007   Past Medical History:  Diagnosis Date  . Anxiety    a lot of stressors. panic attacks in younger years.  . ED (erectile dysfunction)   . GERD (gastroesophageal reflux disease)    very mild  . Hyperlipidemia   . Hypothyroidism   . Thyroid disease   . Tobacco abuse     Family History  Problem Relation Age of Onset  . Hypertension Mother   . Hyperlipidemia Mother   . Colon polyps Mother   .  Hypertension Father   . Hyperlipidemia Father   . Hyperlipidemia Sister   . Hyperlipidemia Brother   . Colon cancer Neg Hx   . Rectal cancer Neg Hx   . Stomach cancer Neg Hx   . Esophageal cancer Neg Hx     Past Surgical History:  Procedure Laterality Date  . APPENDECTOMY  1998  . COLONOSCOPY    . HERNIA REPAIR  2003   right inguinal  . LUMBAR LAMINECTOMY/DECOMPRESSION MICRODISCECTOMY N/A 01/07/2021   Procedure: Lumbar three-lumbar four DECOMPRESSION  and  LEFT MICRODISCECTOMY;  Surgeon: Marybelle Killings, MD;  Location: [facility];  Service: Orthopedics;  Laterality: N/A;  . POLYPECTOMY     Social History   Occupational History  . Not on file  Tobacco Use  . Smoking status: Former Smoker    Packs/day: 1.00    Years: 25.00    Pack years: 25.00    Types: E-cigarettes    Quit date: 11/15/2002    Years since quitting: 18.1  . Smokeless tobacco: Never Used  . Tobacco comment: but still on e cigs daily  Vaping Use  . Vaping Use: Every day  . Start date: 11/15/2002  . Substances: Nicotine  Substance and Sexual Activity  . Alcohol use: Yes    Alcohol/week: 14.0 standard drinks    Types: 14 Cans of beer per week  .  Drug use: No  . Sexual activity: Not on file

## 2021-01-19 NOTE — Discharge Summary (Signed)
Patient ID: Cory Moore MRN: [id number] DOB/AGE: [birth year] 60 y.o.  Admit date: 01/07/2021 Discharge date: 01/08/2021 Admission Diagnoses:  Active Problems:   Spinal stenosis of lumbar region   Lumbar stenosis   L   Discharge Diagnoses:  Active Problems:   Spinal stenosis of lumbar region   Lumbar stenosis   L  status post Procedure(s): Lumbar three-lumbar four DECOMPRESSION  and  LEFT MICRODISCECTOMY  Past Medical History:  Diagnosis Date  . Anxiety    a lot of stressors. panic attacks in younger years.  . ED (erectile dysfunction)   . GERD (gastroesophageal reflux disease)    very mild  . Hyperlipidemia   . Hypothyroidism   . Thyroid disease   . Tobacco abuse     Surgeries: Procedure(s): Lumbar three-lumbar four DECOMPRESSION  and  LEFT MICRODISCECTOMY on 01/07/2021   Consultants:   Discharged Condition: Improved  Hospital Course: Cory Moore is an 60 y.o. male who was admitted 01/07/2021 for operative treatment of lumbar HNP/stenosis. Patient failed conservative treatments (please see the history and physical for the specifics) and had severe unremitting pain that affects sleep, daily activities and work/hobbies. After pre-op clearance, the patient was taken to the operating room on 01/07/2021 and underwent  Procedure(s): Lumbar three-lumbar four DECOMPRESSION  and  LEFT MICRODISCECTOMY.    Patient was given perioperative antibiotics:  Anti-infectives (From admission, onward)   Start     Dose/Rate Route Frequency Ordered Stop   01/08/21 0600  ceFAZolin (ANCEF) 3 g in dextrose 5 % 50 mL IVPB        3 g 100 mL/hr over 30 Minutes Intravenous On call to O.R. 01/07/21 1214 01/08/21 0630   01/07/21 1024  ceFAZolin (ANCEF) 2-4 GM/100ML-% IVPB  Status:  Discontinued       Note to Pharmacy: Tamsen Snider   : cabinet override      01/07/21 1024 01/07/21 1328       Patient was given sequential compression devices and early ambulation to prevent DVT.    Patient benefited maximally from hospital stay and there were no complications. At the time of discharge, the patient was urinating/moving their bowels without difficulty, tolerating a regular diet, pain is controlled with oral pain medications and they have been cleared by PT/OT.   Recent vital signs: No data found.   Recent laboratory studies: No results for input(s): WBC, HGB, HCT, PLT, NA, K, CL, CO2, BUN, CREATININE, GLUCOSE, INR, CALCIUM in the last 72 hours.  Invalid input(s): PT, 2   Discharge Medications:   Allergies as of 01/08/2021   No Known Allergies     Medication List    STOP taking these medications   aspirin EC 81 MG tablet   baclofen 10 MG tablet Commonly known as: LIORESAL   predniSONE 10 MG tablet Commonly known as: DELTASONE     TAKE these medications   levothyroxine 125 MCG tablet Commonly known as: SYNTHROID Take 1 tablet (125 mcg total) by mouth daily.   methocarbamol 500 MG tablet Commonly known as: Robaxin Take 1 tablet (500 mg total) by mouth every 6 (six) hours as needed for muscle spasms.   oxyCODONE-acetaminophen 5-325 MG tablet Commonly known as: PERCOCET/ROXICET Take 1-2 tablets by mouth every 4 (four) hours as needed for severe pain. What changed: how much to take   sildenafil 100 MG tablet Commonly known as: VIAGRA TAKE 1/2 TO 1 TABLET BY MOUTH DAILY AS NEEDED FOR ERECTILE DYSFUNCTION What changed: See the new instructions.  simvastatin 40 MG tablet Commonly known as: ZOCOR TAKE 1 TABLET BY MOUTH EVERY DAY IN THE EVENING What changed:   how much to take  how to take this  when to take this  additional instructions   VITAMIN B-12 PO Take 1 tablet by mouth every other day.       Diagnostic Studies: DG Lumbar Spine 2-3 Views  Result Date: 01/07/2021 CLINICAL DATA:  L3-L4 decompression and LEFT micro diskectomy EXAM: LUMBAR SPINE - 2-3 VIEW COMPARISON:  Cross-table lateral intraoperative images compared to 12/31/2020.  FINDINGS: 5 non-rib-bearing lumbar type vertebra on prior exam. Image at 1401 hours: Surgical instruments via dorsal approach project dorsal to the L3 and L4 vertebral bodies. Image at 6283 hours: Metallic probes project dorsal to the inferior L3 and superior L4 levels. IMPRESSION: Intraoperative lumbar localization images as above. Electronically Signed   By: Lavonia Dana M.D.   On: 01/07/2021 17:25   MR THORACIC SPINE WO CONTRAST  Result Date: 01/03/2021 CLINICAL DATA:  Onset left foot drop last night. Left leg tingling for several days. EXAM: MRI THORACIC AND LUMBAR SPINE WITHOUT CONTRAST TECHNIQUE: Multiplanar and multiecho pulse sequences of the thoracic and lumbar spine were obtained without intravenous contrast. COMPARISON:  None. FINDINGS: MRI THORACIC SPINE FINDINGS Alignment:  Normal. Vertebrae: No fracture or worrisome lesion. Hemangioma in T7 incidentally noted. A few small Schmorl's nodes are seen in the lower thoracic spine. Cord:  Normal signal throughout. Paraspinal and other soft tissues: Negative. Disc levels: T3-4: Very shallow left paracentral protrusion without stenosis. T6-7: Shallow right paracentral protrusion with cephalad extension. No stenosis. T8-9: Shallow right paracentral protrusion without stenosis. Except as described, intervertebral disc spaces are negative. MRI LUMBAR SPINE FINDINGS Segmentation:  Standard. Alignment:  Normal. Vertebrae:  No fracture, evidence of discitis, or bone lesion. Conus medullaris and cauda equina: Conus extends to the L1 level. Conus and cauda equina appear normal. Paraspinal and other soft tissues: Parapelvic left renal cyst versus prominent renal pelvis noted. Disc levels: L1-2: Negative. L2-3: Negative. L3-4: The patient has a large broad-based central disc protrusion. There is also some ligamentum flavum thickening and mild to moderate facet arthropathy. Severe central canal and bilateral subarticular recess narrowing is present. Protruding disc  just beyond the right foramen contacts the right L3 root without compression or displacement. L4-5: Shallow central protrusion without stenosis. L5-S1: Negative. IMPRESSION: MR THORACIC SPINE IMPRESSION Mild thoracic degenerative change without stenosis. MR LUMBAR SPINE IMPRESSION Severe central canal and bilateral subarticular recess narrowing at L3-4 where there is a large broad-based central protrusion, ligamentum flavum thickening and mild to moderate facet degenerative disease. Protruding disc in the far periphery of the right foramen at L3-4 contacts the L3 root without compression or displacement. Electronically Signed   By: Inge Rise M.D.   On: 01/03/2021 15:24   MR LUMBAR SPINE WO CONTRAST  Result Date: 01/03/2021 CLINICAL DATA:  Onset left foot drop last night. Left leg tingling for several days. EXAM: MRI THORACIC AND LUMBAR SPINE WITHOUT CONTRAST TECHNIQUE: Multiplanar and multiecho pulse sequences of the thoracic and lumbar spine were obtained without intravenous contrast. COMPARISON:  None. FINDINGS: MRI THORACIC SPINE FINDINGS Alignment:  Normal. Vertebrae: No fracture or worrisome lesion. Hemangioma in T7 incidentally noted. A few small Schmorl's nodes are seen in the lower thoracic spine. Cord:  Normal signal throughout. Paraspinal and other soft tissues: Negative. Disc levels: T3-4: Very shallow left paracentral protrusion without stenosis. T6-7: Shallow right paracentral protrusion with cephalad extension. No stenosis. T8-9: Shallow right  paracentral protrusion without stenosis. Except as described, intervertebral disc spaces are negative. MRI LUMBAR SPINE FINDINGS Segmentation:  Standard. Alignment:  Normal. Vertebrae:  No fracture, evidence of discitis, or bone lesion. Conus medullaris and cauda equina: Conus extends to the L1 level. Conus and cauda equina appear normal. Paraspinal and other soft tissues: Parapelvic left renal cyst versus prominent renal pelvis noted. Disc levels:  L1-2: Negative. L2-3: Negative. L3-4: The patient has a large broad-based central disc protrusion. There is also some ligamentum flavum thickening and mild to moderate facet arthropathy. Severe central canal and bilateral subarticular recess narrowing is present. Protruding disc just beyond the right foramen contacts the right L3 root without compression or displacement. L4-5: Shallow central protrusion without stenosis. L5-S1: Negative. IMPRESSION: MR THORACIC SPINE IMPRESSION Mild thoracic degenerative change without stenosis. MR LUMBAR SPINE IMPRESSION Severe central canal and bilateral subarticular recess narrowing at L3-4 where there is a large broad-based central protrusion, ligamentum flavum thickening and mild to moderate facet degenerative disease. Protruding disc in the far periphery of the right foramen at L3-4 contacts the L3 root without compression or displacement. Electronically Signed   By: Inge Rise M.D.   On: 01/03/2021 15:24    Discharge Instructions    Incentive spirometry RT   Complete by: As directed        Follow-up Information    Schedule an appointment as soon as possible for a visit with Marybelle Killings, MD.   Specialty: Orthopedic Surgery Why: need return office visit one week postop.  call for appointment.  Contact information: [address] [place] [address] [phone]               Discharge Plan:  discharge to home  Disposition:     Signed: Benjiman Core 01/19/2021, 2:34 PM

## 2021-01-20 ENCOUNTER — Encounter: Payer: Self-pay | Admitting: Orthopaedic Surgery

## 2021-01-20 ENCOUNTER — Ambulatory Visit (INDEPENDENT_AMBULATORY_CARE_PROVIDER_SITE_OTHER): Payer: 59 | Admitting: Orthopaedic Surgery

## 2021-01-20 ENCOUNTER — Other Ambulatory Visit: Payer: Self-pay

## 2021-01-20 VITALS — BP 137/82 | HR 87 | Ht 76.0 in | Wt 265.0 lb

## 2021-01-20 DIAGNOSIS — M48062 Spinal stenosis, lumbar region with neurogenic claudication: Secondary | ICD-10-CM

## 2021-01-20 NOTE — Progress Notes (Signed)
Follow-up L3-4 decompression microdiscectomy.  Incision was good staples are harvested Steri-Strips applied he can peel the Steri-Strips off in a week if they are still present.  He is doing his walking he is off his pain medication.  I will check him for hopeful final visit in 4 weeks.

## 2021-02-05 ENCOUNTER — Telehealth: Payer: Self-pay | Admitting: Orthopaedic Surgery

## 2021-02-05 NOTE — Telephone Encounter (Signed)
Patient asking if its "normal" to start feeling better than starting to have pain again, "normal healing?" would like a call back

## 2021-02-05 NOTE — Telephone Encounter (Signed)
Pt called and would like someone to call him. He has some question concerning his surgery from a month ago. Cb [phone]

## 2021-02-07 DIAGNOSIS — H9319 Tinnitus, unspecified ear: Secondary | ICD-10-CM | POA: Insufficient documentation

## 2021-02-17 ENCOUNTER — Other Ambulatory Visit: Payer: Self-pay

## 2021-02-17 ENCOUNTER — Encounter: Payer: Self-pay | Admitting: Orthopaedic Surgery

## 2021-02-17 ENCOUNTER — Ambulatory Visit (INDEPENDENT_AMBULATORY_CARE_PROVIDER_SITE_OTHER): Payer: 59 | Admitting: Orthopaedic Surgery

## 2021-02-17 DIAGNOSIS — Z9889 Other specified postprocedural states: Secondary | ICD-10-CM

## 2021-02-17 NOTE — Progress Notes (Signed)
   Post-Op Visit Note   Patient: Cory Moore           Date of Birth: 1961-09-16           MRN: [id number] Visit Date: 02/17/2021 PCP: Marin Olp, MD   Assessment & Plan:  Chief Complaint:  Chief Complaint  Patient presents with  . Lower Back - Follow-up    01/07/2021 L3-4 decompression, left microdiscectomy   Visit Diagnoses:  1. Status post lumbar spine surgery for decompression of spinal cord     Plan:   Follow-Up Instructions: Return in about 1 month (around 03/19/2021).   Orders:  No orders of the defined types were placed in this encounter.  No orders of the defined types were placed in this encounter.   Imaging: No results found.  PMFS History: Patient Active Problem List   Diagnosis Date Noted  . Status post lumbar spine surgery for decompression of spinal cord 02/17/2021  . L 01/09/2021  . Lumbar stenosis 01/07/2021  . Foot drop, left 01/05/2021  . Spinal stenosis of lumbar region 01/05/2021  . B12 deficiency 09/10/2019  . History of adenomatous polyp of colon 03/24/2018  . Former smoker 12/12/2012  . ERECTILE DYSFUNCTION 10/03/2008  . Hypothyroidism 10/02/2007  . Hyperlipidemia 10/02/2007   Past Medical History:  Diagnosis Date  . Anxiety    a lot of stressors. panic attacks in younger years.  . ED (erectile dysfunction)   . GERD (gastroesophageal reflux disease)    very mild  . Hyperlipidemia   . Hypothyroidism   . Thyroid disease   . Tobacco abuse     Family History  Problem Relation Age of Onset  . Hypertension Mother   . Hyperlipidemia Mother   . Colon polyps Mother   . Hypertension Father   . Hyperlipidemia Father   . Hyperlipidemia Sister   . Hyperlipidemia Brother   . Colon cancer Neg Hx   . Rectal cancer Neg Hx   . Stomach cancer Neg Hx   . Esophageal cancer Neg Hx     Past Surgical History:  Procedure Laterality Date  . APPENDECTOMY  1998  . COLONOSCOPY    . HERNIA REPAIR  2003   right inguinal  . LUMBAR  LAMINECTOMY/DECOMPRESSION MICRODISCECTOMY N/A 01/07/2021   Procedure: Lumbar three-lumbar four DECOMPRESSION  and  LEFT MICRODISCECTOMY;  Surgeon: Marybelle Killings, MD;  Location: [facility];  Service: Orthopedics;  Laterality: N/A;  . POLYPECTOMY     Social History   Occupational History  . Not on file  Tobacco Use  . Smoking status: Former Smoker    Packs/day: 1.00    Years: 25.00    Pack years: 25.00    Types: E-cigarettes    Quit date: 11/15/2002    Years since quitting: 18.2  . Smokeless tobacco: Never Used  . Tobacco comment: but still on e cigs daily  Vaping Use  . Vaping Use: Every day  . Start date: 11/15/2002  . Substances: Nicotine  Substance and Sexual Activity  . Alcohol use: Yes    Alcohol/week: 14.0 standard drinks    Types: 14 Cans of beer per week  . Drug use: No  . Sexual activity: Not on file

## 2021-02-24 ENCOUNTER — Encounter: Payer: Self-pay | Admitting: Family Medicine

## 2021-02-24 ENCOUNTER — Other Ambulatory Visit (INDEPENDENT_AMBULATORY_CARE_PROVIDER_SITE_OTHER): Payer: 59

## 2021-02-24 ENCOUNTER — Other Ambulatory Visit: Payer: Self-pay

## 2021-02-24 ENCOUNTER — Telehealth (INDEPENDENT_AMBULATORY_CARE_PROVIDER_SITE_OTHER): Payer: 59 | Admitting: Family Medicine

## 2021-02-24 DIAGNOSIS — R1032 Left lower quadrant pain: Secondary | ICD-10-CM

## 2021-02-24 DIAGNOSIS — N50812 Left testicular pain: Secondary | ICD-10-CM

## 2021-02-24 LAB — POC URINALSYSI DIPSTICK (AUTOMATED)
Bilirubin, UA: NEGATIVE
Blood, UA: NEGATIVE
Glucose, UA: NEGATIVE
Ketones, UA: NEGATIVE
Leukocytes, UA: NEGATIVE
Nitrite, UA: NEGATIVE
Protein, UA: NEGATIVE
Spec Grav, UA: 1.015 (ref 1.010–1.025)
Urobilinogen, UA: 0.2 E.U./dL
pH, UA: 6 (ref 5.0–8.0)

## 2021-02-24 NOTE — Addendum Note (Signed)
Addended by: Linton Ham on: 02/24/2021 03:05 PM   Modules accepted: Orders

## 2021-02-24 NOTE — Patient Instructions (Addendum)
  Depression screen [facility] 2/9 02/24/2021 09/30/2020 09/10/2019  Decreased Interest 0 0 0  Down, Depressed, Hopeless 0 0 0  PHQ - 2 Score 0 0 0    Recommended follow up: No follow-ups on file.

## 2021-02-24 NOTE — Progress Notes (Signed)
Phone [phone] Virtual visit via Video note   Subjective:  Chief complaint: Chief Complaint  Patient presents with  . Groin Pain    The pain is in his groin area and his left testicle. Patient states that this pain started after he had surgery and he wants to know if this could be related to the back surgery or is this another problem.   . Surgery Questions     He has some general questions about his recent back surgery.    This visit type was conducted due to national recommendations for restrictions regarding the COVID-19 Pandemic (e.g. social distancing).  This format is felt to be most appropriate for this patient at this time balancing risks to patient and risks to population by having him in for in person visit.  No physical exam was performed (except for noted visual exam or audio findings with Telehealth visits).    Our team/I connected with Cory Moore at  2:20 PM EDT by a video enabled telemedicine application ([email or web address] or caregility through epic) and verified that I am speaking with the correct person using two identifiers.  Location patient: Home[facility] Location provider: [facility], office Persons participating in the virtual visit:  patient  Our team/I discussed the limitations of evaluation and management by telemedicine and the availability of in person appointments. In light of current covid-19 pandemic, patient also understands that we are trying to protect them by minimizing in office contact if at all possible.  The patient expressed consent for telemedicine visit and agreed to proceed. Patient understands insurance will be billed.   Past Medical History-  Patient Active Problem List   Diagnosis Date Noted  . History of adenomatous polyp of colon 03/24/2018    Priority: Medium  . Hypothyroidism 10/02/2007    Priority: Medium  . Hyperlipidemia 10/02/2007    Priority: Medium  . Former smoker 12/12/2012    Priority: Low  . ERECTILE DYSFUNCTION 10/03/2008     Priority: Low  . Status post lumbar spine surgery for decompression of spinal cord 02/17/2021  . L 01/09/2021  . Foot drop, left 01/05/2021  . B12 deficiency 09/10/2019    Medications- reviewed and updated Current Outpatient Medications  Medication Sig Dispense Refill  . Cyanocobalamin (VITAMIN B-12 PO) Take 1 tablet by mouth every other day.    . ibuprofen (ADVIL) 200 MG tablet Take 200 mg by mouth every 6 (six) hours as needed.    Marland Kitchen levothyroxine (SYNTHROID) 125 MCG tablet Take 1 tablet (125 mcg total) by mouth daily. 90 tablet 3  . sildenafil (VIAGRA) 100 MG tablet TAKE 1/2 TO 1 TABLET BY MOUTH DAILY AS NEEDED FOR ERECTILE DYSFUNCTION (Patient taking differently: Take 50-100 mg by mouth as needed for erectile dysfunction.) 6 tablet 29  . simvastatin (ZOCOR) 40 MG tablet TAKE 1 TABLET BY MOUTH EVERY DAY IN THE EVENING (Patient taking differently: Take 40 mg by mouth every evening.) 90 tablet 3  . methocarbamol (ROBAXIN) 500 MG tablet Take 1 tablet (500 mg total) by mouth every 6 (six) hours as needed for muscle spasms. (Patient not taking: No sig reported) 50 tablet 0  . oxyCODONE-acetaminophen (PERCOCET/ROXICET) 5-325 MG tablet Take 1-2 tablets by mouth every 4 (four) hours as needed for severe pain. (Patient not taking: No sig reported) 40 tablet 0   No current facility-administered medications for this visit.     Objective:  There were no vitals taken for this visit. self reported vitals Gen: NAD, resting comfortably Lungs: nonlabored, normal  respiratory rate  Skin: appears dry, no obvious rash     Assessment and Plan   #Left groin pain/left testicular pain starting after lumbar decompression and left microdiscectomy S:Patient with herniated disc surgery in mid February due to herniated disc with Dr. Lorin Mercy.  Had 6 week follow up last week and has another follow up in may plan.   He is having some soreness in his back from the sugery itself.  Has a similar normal intensity of  pain in left groin/left testicle.He has also felt some pain in his groin worse on the left side and down into testicle and lower abdomen. Goes to bed around to bed at 10 and wakes up around 1-2 and then again around 5 to urinate- up from once a night prior to surgery. No blood in urine. No burning when he pees or discharge from penis. No rectal discomfort.  Did have 1 bowel movement early on after surgery but none since that time- also mentioned to Dr. Lorin Mercy  For first 10 days after surgery took muscle relaxant and oxycodone as prescribed then switched to extra strength tylenol. Groin pain started after surgery. Mentioned to Dr. Lorin Mercy last week- do not see mentioned in notes. 28th of march started ibuprofen 200mg  4x a day during waking hours.   Pain rather constant- worse in the morning. Sitting no pain , standing 5-6/10 pain. Took a day off of ibuprofen yesterday and pain was worse- ended up taking 1 today and then 1 later in day around lunch.   Prior to surgery perhaps over time would have intermittent testicular pain on left or right but this left groin pain has been more persistent.  A/P: Patient with left groin/left testicular pain starting after surgery for lumbar decompression and left microdiscectomy.  Does have increased polyuria twice a night from once a night-we will check urine and urine culture.  Ideally I was able be in the office today with we will done this in person as I think he also needs an exam to rule out hernia in the left groin, evaluate left hip no I doubt that is the cause, potential rectal exam-prostatitis in differential.  With testicular pain we also opted to get left testicular ultrasound.  If ultrasound and urine evaluation are normal he will follow up with Dr. Lorin Mercy as next step and if no obvious cause is found we will follow-up in person to reevaluate -Has been monogamous with girlfriend for extended period and we opted out of std testing  Recommended follow up:  As needed  within next month after urine and ultrasound if symptoms fail to improve and no cause of symptoms found Future Appointments  Date Time Provider [place]  02/24/2021  3:00 PM [facility] LAB [facility]  03/24/2021  1:15 PM Marybelle Killings, MD [facility] None  04/23/2021  9:00 AM [facility] NURSE [facility]  10/01/2021  8:00 AM Marin Olp, MD [facility]    Lab/Order associations:   ICD-10-CM   1. Left groin pain  R10.32 POCT Urinalysis Dipstick (Automated)    Urine Culture    US SCROTUM W/DOPPLER  2. Left testicular pain  N50.812 US SCROTUM W/DOPPLER    Time Spent: 25 minutes of total time (1:59 PM- 2:24 PM) was spent on the date of the encounter performing the following actions: chart review prior to seeing the patient, obtaining history, performing a medically necessary exam, counseling on the treatment plan, placing orders, and documenting in our EHR.   Return precautions  advised.  Garret Reddish, MD

## 2021-02-25 LAB — URINE CULTURE
MICRO NUMBER:: 11760266
Result:: NO GROWTH
SPECIMEN QUALITY:: ADEQUATE

## 2021-03-09 ENCOUNTER — Telehealth: Payer: Self-pay | Admitting: Orthopaedic Surgery

## 2021-03-09 NOTE — Telephone Encounter (Signed)
Patient called requesting a call back from Dr. Lorin Mercy or assistant about pains in hip and abdomin. Please call patient at [phone].

## 2021-03-10 ENCOUNTER — Ambulatory Visit
Admission: RE | Admit: 2021-03-10 | Discharge: 2021-03-10 | Disposition: A | Discharge status: Home / Self Care | Payer: 59 | Source: Ambulatory Visit | Attending: Family Medicine | Admitting: Family Medicine

## 2021-03-10 DIAGNOSIS — R1032 Left lower quadrant pain: Secondary | ICD-10-CM

## 2021-03-10 DIAGNOSIS — N50812 Left testicular pain: Secondary | ICD-10-CM

## 2021-03-10 IMAGING — US US SCROTUM W/ DOPPLER COMPLETE
1 series · 14 of 25 positions shown · non-contrast
Comparison: None.

CLINICAL DATA: Left inguinal/scrotal pain

EXAM:
SCROTAL ULTRASOUND
DOPPLER ULTRASOUND OF THE TESTICLES
TECHNIQUE: Complete ultrasound examination of the testicles, epididymis, and
other scrotal structures was performed. Color and spectral Doppler
ultrasound were also utilized to evaluate blood flow to the
testicles.

[Series 1: us scrotum w/ doppler complete · 0.07mm/px · 14 of 44 slices shown]
[im 1/44]
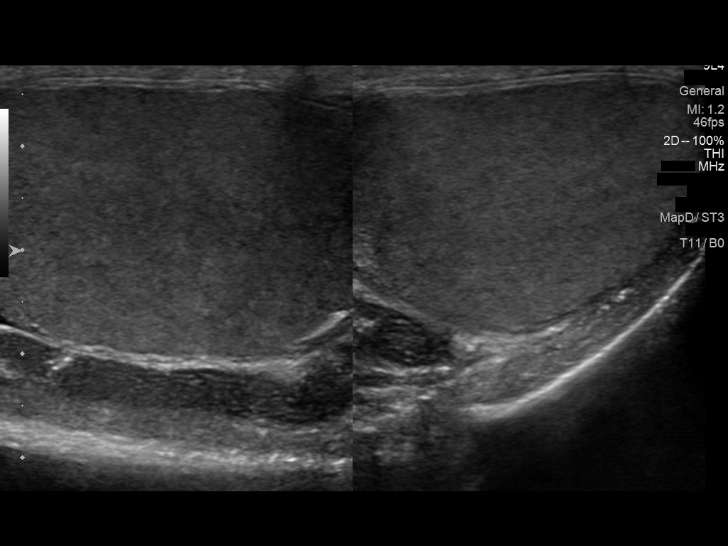
[im 4/44]
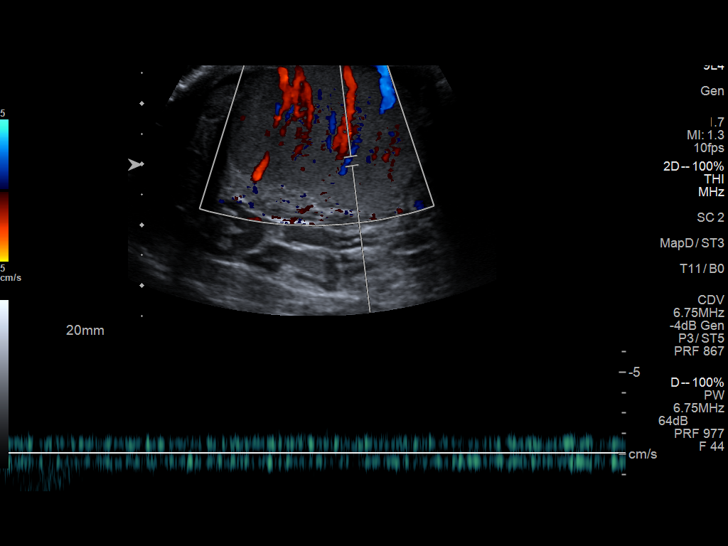
[im 8/44]
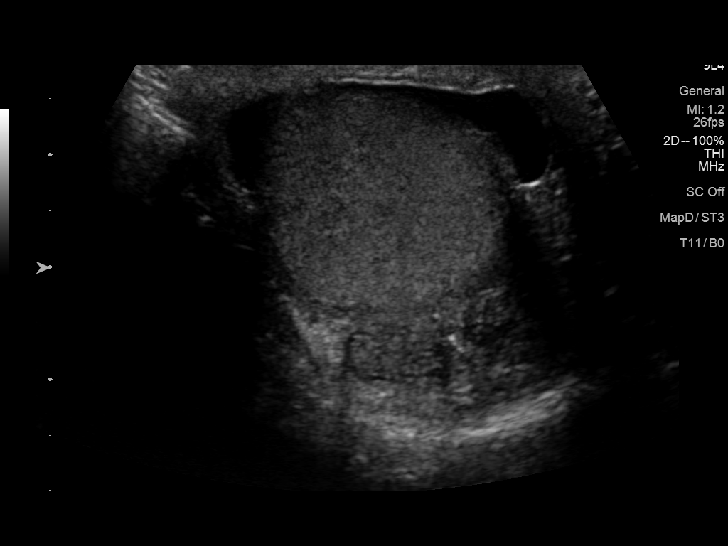
[im 11/44]
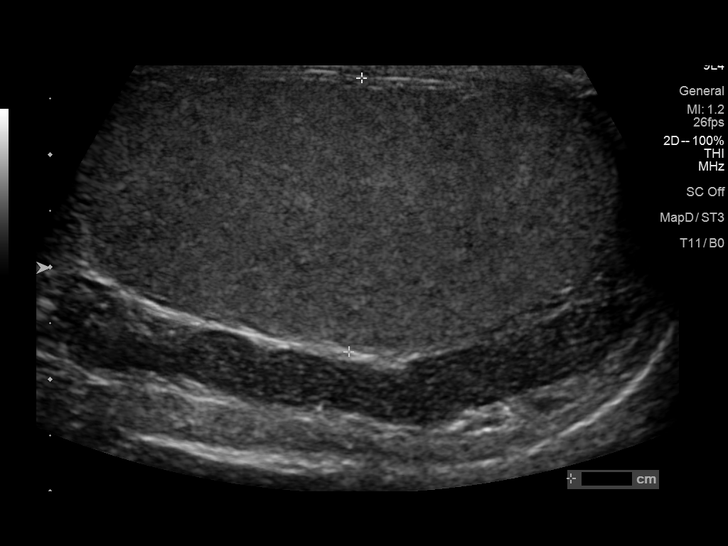
[im 15/44]
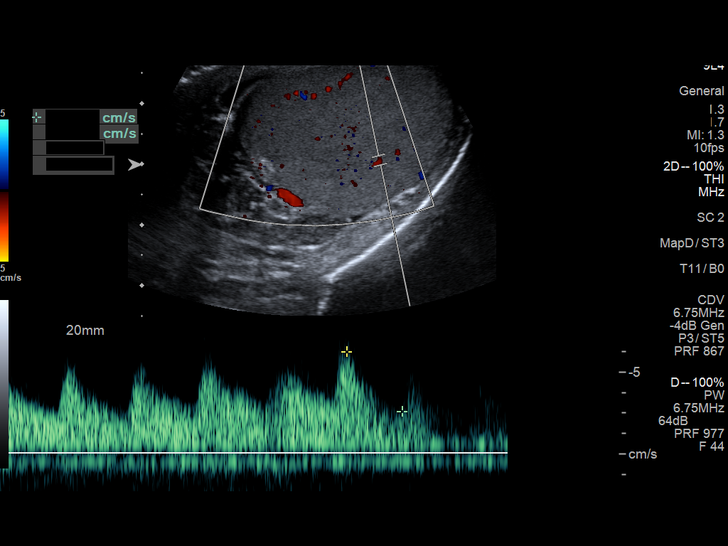
[im 17/44]
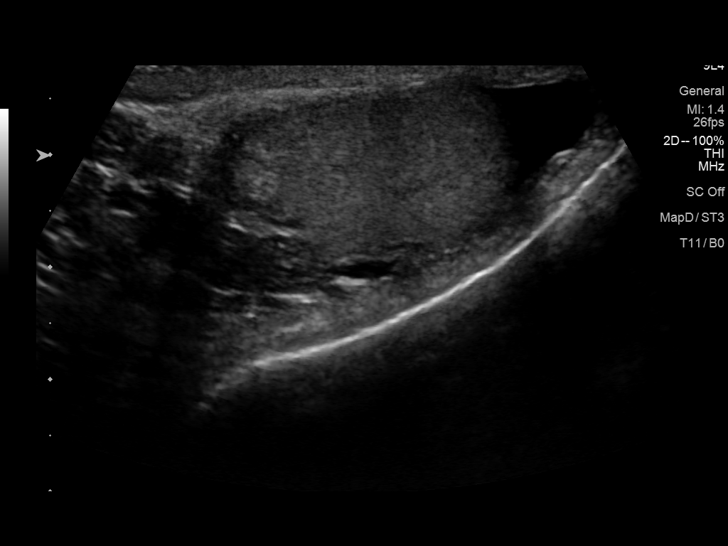
[im 20/44]
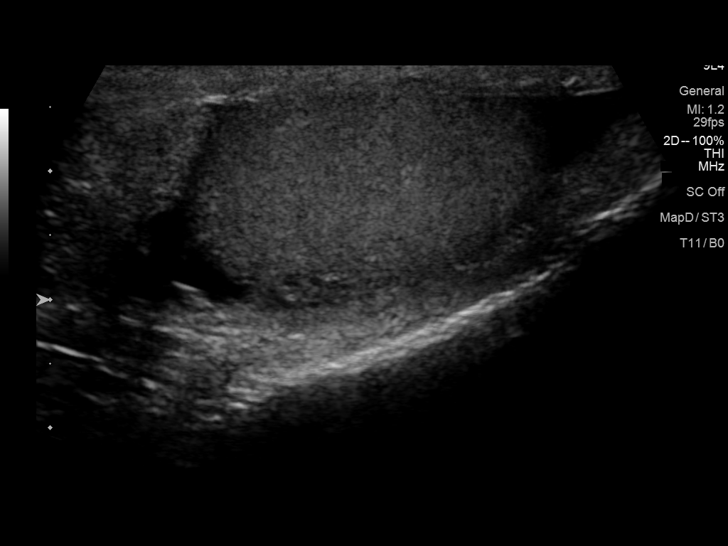
[im 24/44]
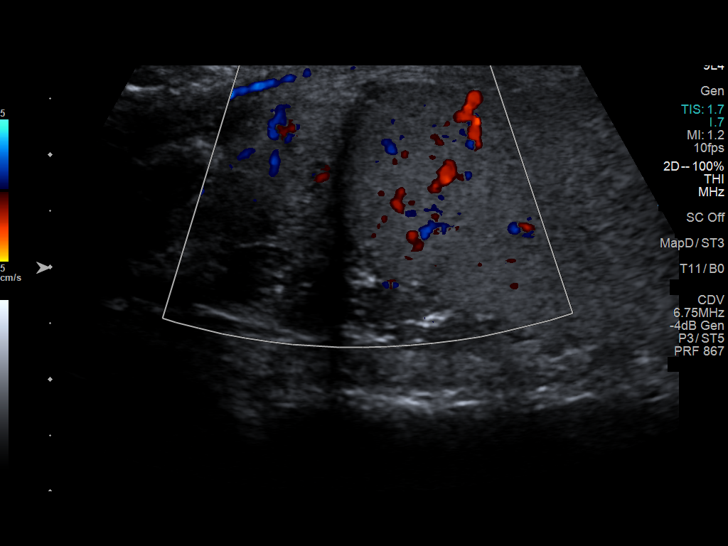
[im 27/44]
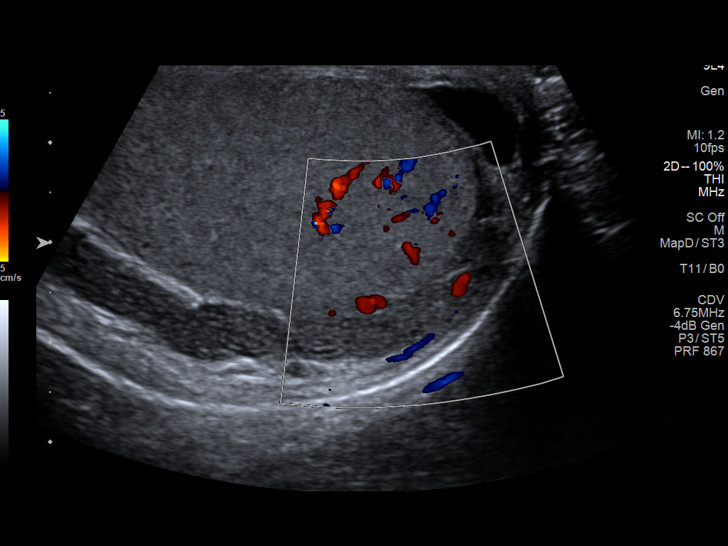
[im 29/44]
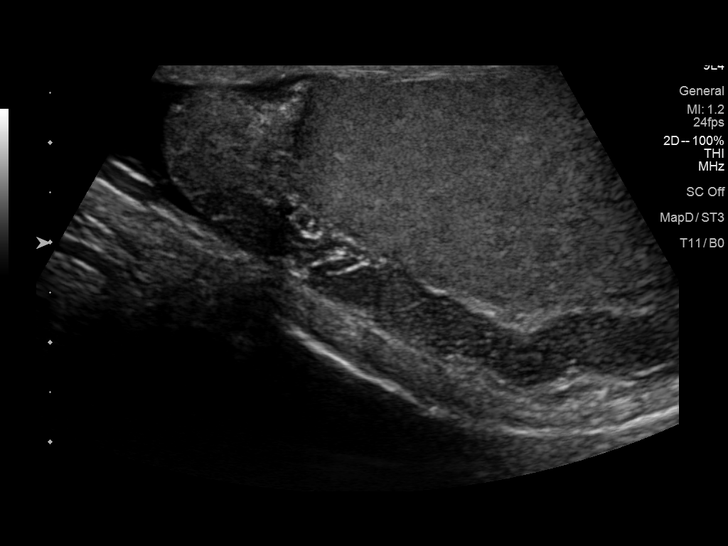
[im 33/44]
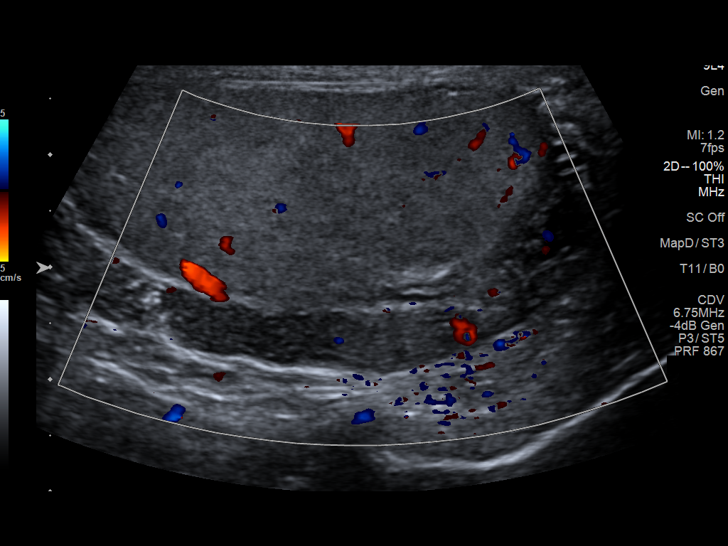
[im 36/44]
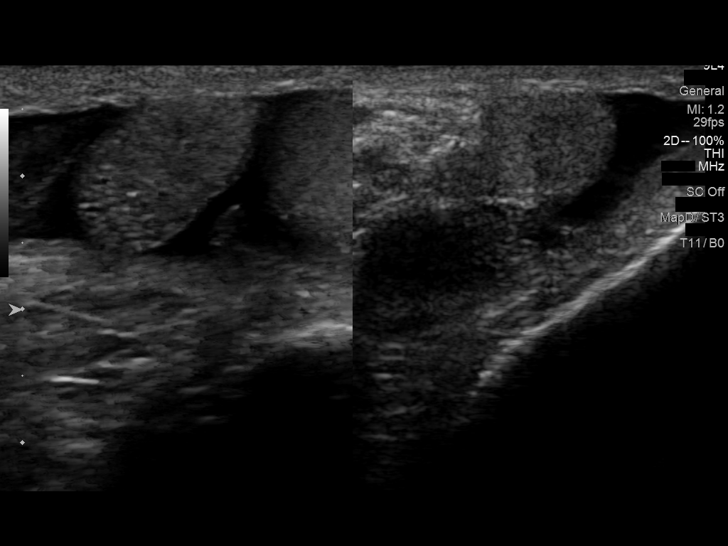
[im 40/44]
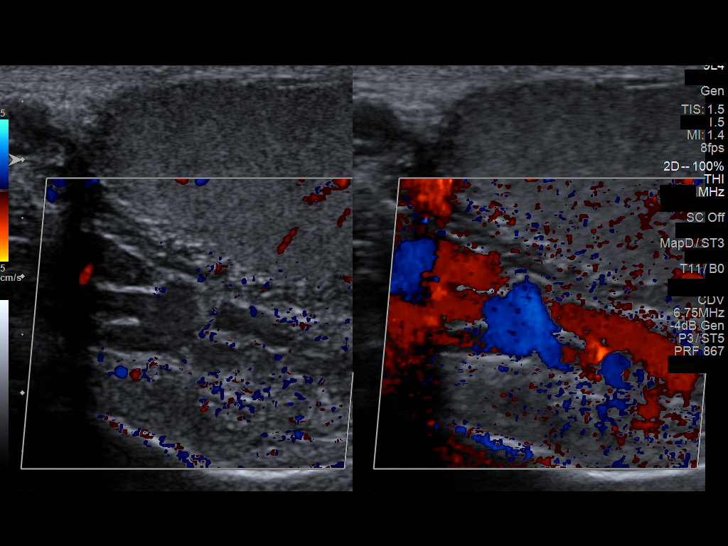
[im 44/44]
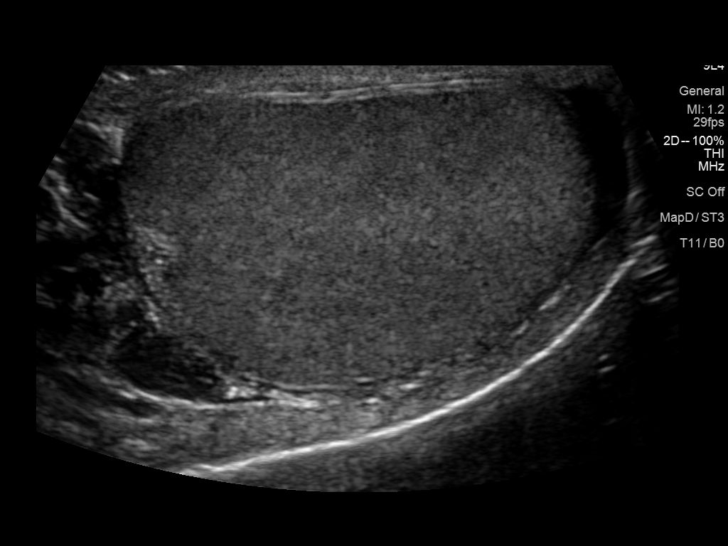

[14 of 25 positions shown; findings below may reference images not displayed]

FINDINGS: Right testicle

Measurements: 4.8 x 2.4 x 3.4 cm. No mass or microlithiasis
visualized.

Left testicle

Measurements: 4.4 x 2.3 x 3.7 cm. No mass or microlithiasis
visualized.

Right epididymis: Normal in size and appearance. No edema or
hyperemia.

Left epididymis: Normal in size and appearance. No edema or
hyperemia.

Hydrocele:  Small hydrocele on each side.

Varicocele:  None visualized.

Pulsed Doppler interrogation of both testes demonstrates normal low
resistance arterial and venous waveforms bilaterally.

No evidence scrotal wall thickening or scrotal abscess on either
side.
IMPRESSION: Small hydrocele on each side. No intratesticular or extratesticular
mass on either side. No orchitis or epididymitis on either side. No
testicular torsion on either side.

## 2021-03-10 NOTE — Telephone Encounter (Signed)
I called discussed. UA was checked , has scrotal ultrasound today etc.

## 2021-03-24 ENCOUNTER — Ambulatory Visit (INDEPENDENT_AMBULATORY_CARE_PROVIDER_SITE_OTHER): Payer: 59 | Admitting: Orthopaedic Surgery

## 2021-03-24 ENCOUNTER — Encounter: Payer: Self-pay | Admitting: Orthopaedic Surgery

## 2021-03-24 ENCOUNTER — Ambulatory Visit: Payer: 59 | Admitting: Orthopaedic Surgery

## 2021-03-24 VITALS — BP 103/70 | HR 92 | Ht 76.0 in | Wt 265.0 lb

## 2021-03-24 DIAGNOSIS — Z9889 Other specified postprocedural states: Secondary | ICD-10-CM

## 2021-03-24 NOTE — Progress Notes (Signed)
Office Visit Note   Patient: Cory Moore           Date of Birth: [birth year]           MRN: [id number] Visit Date: 03/24/2021              Requested by: Marin Olp, MD [place],  [place] [address] PCP: Marin Olp, MD   Assessment & Plan: Visit Diagnoses:  1. Status post lumbar spine surgery for decompression of spinal cord     Plan: We will set patient up for some physical therapy to work on left hip flexion strength.  It is improved since surgery but he still not got strong enough for you can resume hiking heels and kayaking and other activities that he likes to participate in with his family on the weekends.  Follow-Up Instructions: Return in about 5 weeks (around 04/28/2021).   Orders:  No orders of the defined types were placed in this encounter.  No orders of the defined types were placed in this encounter.     Procedures: No procedures performed   Clinical Data: No additional findings.   Subjective: Chief Complaint  Patient presents with  . Lower Back - Routine Post Op    HPI 60 year old male returns post L3-4 lumbar decompression for spinal stenosis.  He still has some residual hip flexion weakness but it is improved from preop.  He effect on flat areas then did some low he also noticed weakness with hip flexion which has improved diuresis few weeks but still prevents him from kayaking and doing other activities he is interested in an in use participate in regularly.  He had no other areas of tightness other than the surgery L3-4 level.  Review of Systems updated unchanged since surgery.   Objective: Vital Signs: BP 103/70   Pulse 92   Ht 6\' 4"  (1.93 m)   Wt 265 lb (120.2 kg)   BMI 32.26 kg/m   Physical Exam Constitutional:      Appearance: He is well-developed.  HENT:     Head: Normocephalic and atraumatic.  Eyes:     Pupils: Pupils are equal, round, and reactive to light.  Neck:     Thyroid: No thyromegaly.      Trachea: No tracheal deviation.  Cardiovascular:     Rate and Rhythm: Normal rate.  Pulmonary:     Effort: Pulmonary effort is normal.     Breath sounds: No wheezing.  Abdominal:     General: Bowel sounds are normal.     Palpations: Abdomen is soft.  Skin:    General: Skin is warm and dry.     Capillary Refill: Capillary refill takes less than 2 seconds.  Neurological:     Mental Status: He is alert and oriented to person, place, and time.  Psychiatric:        Behavior: Behavior normal.        Thought Content: Thought content normal.        Judgment: Judgment normal.     Ortho Exam lumbar incision well-healed.  Negative straight leg raising 90 degrees.  Trace VMO atrophy on the left.  He does have the hip flexion weakness on the left which is 4 out of 5.  Anterior tib gastrocsoleus is strong.  Specialty Comments:  No specialty comments available.  Imaging: No results found.   PMFS History: Patient Active Problem List   Diagnosis Date Noted  . Status post lumbar spine  surgery for decompression of spinal cord 02/17/2021  . L 01/09/2021  . Foot drop, left 01/05/2021  . B12 deficiency 09/10/2019  . History of adenomatous polyp of colon 03/24/2018  . Former smoker 12/12/2012  . ERECTILE DYSFUNCTION 10/03/2008  . Hypothyroidism 10/02/2007  . Hyperlipidemia 10/02/2007   Past Medical History:  Diagnosis Date  . Anxiety    a lot of stressors. panic attacks in younger years.  . ED (erectile dysfunction)   . GERD (gastroesophageal reflux disease)    very mild  . Hyperlipidemia   . Hypothyroidism   . Thyroid disease   . Tobacco abuse     Family History  Problem Relation Age of Onset  . Hypertension Mother   . Hyperlipidemia Mother   . Colon polyps Mother   . Hypertension Father   . Hyperlipidemia Father   . Hyperlipidemia Sister   . Hyperlipidemia Brother   . Colon cancer Neg Hx   . Rectal cancer Neg Hx   . Stomach cancer Neg Hx   . Esophageal cancer Neg Hx      Past Surgical History:  Procedure Laterality Date  . APPENDECTOMY  1998  . COLONOSCOPY    . HERNIA REPAIR  2003   right inguinal  . LUMBAR LAMINECTOMY/DECOMPRESSION MICRODISCECTOMY N/A 01/07/2021   Procedure: Lumbar three-lumbar four DECOMPRESSION  and  LEFT MICRODISCECTOMY;  Surgeon: Marybelle Killings, MD;  Location: [facility];  Service: Orthopedics;  Laterality: N/A;  . POLYPECTOMY     Social History   Occupational History  . Not on file  Tobacco Use  . Smoking status: Former Smoker    Packs/day: 1.00    Years: 25.00    Pack years: 25.00    Types: E-cigarettes    Quit date: 11/15/2002    Years since quitting: 18.3  . Smokeless tobacco: Never Used  . Tobacco comment: but still on e cigs daily  Vaping Use  . Vaping Use: Every day  . Start date: 11/15/2002  . Substances: Nicotine  Substance and Sexual Activity  . Alcohol use: Yes    Alcohol/week: 14.0 standard drinks    Types: 14 Cans of beer per week  . Drug use: No  . Sexual activity: Not on file

## 2021-03-24 NOTE — Addendum Note (Signed)
Addended by: Robyne Peers on: 03/24/2021 02:11 PM   Modules accepted: Orders

## 2021-04-02 ENCOUNTER — Ambulatory Visit: Payer: 59

## 2021-04-15 ENCOUNTER — Other Ambulatory Visit: Payer: Self-pay

## 2021-04-15 ENCOUNTER — Ambulatory Visit (INDEPENDENT_AMBULATORY_CARE_PROVIDER_SITE_OTHER): Payer: 59 | Admitting: Physical Therapy

## 2021-04-15 ENCOUNTER — Encounter: Payer: Self-pay | Admitting: Physical Therapy

## 2021-04-15 DIAGNOSIS — M545 Low back pain, unspecified: Secondary | ICD-10-CM

## 2021-04-15 DIAGNOSIS — M6281 Muscle weakness (generalized): Secondary | ICD-10-CM | POA: Diagnosis not present

## 2021-04-15 DIAGNOSIS — M25552 Pain in left hip: Secondary | ICD-10-CM | POA: Diagnosis not present

## 2021-04-15 DIAGNOSIS — R262 Difficulty in walking, not elsewhere classified: Secondary | ICD-10-CM | POA: Diagnosis not present

## 2021-04-15 NOTE — Patient Instructions (Signed)
Access Code: [email or web address] URL: [email or web address] Date: 04/15/2021 Prepared by: Kearney Hard  Exercises Hooklying Hamstring Stretch with Strap - 2 x daily - 7 x weekly - 5 reps - 30 seconds hold Seated Hamstring Stretch - 2 x daily - 7 x weekly - 5 reps - 30 seconds hold Supine Transversus Abdominis Bracing - Hands on Stomach - 2 x daily - 7 x weekly - 10 reps - 5 seconds hold Supine Lower Trunk Rotation - 2 x daily - 7 x weekly - 5 reps - 10 seconds hold Standing Lumbar Extension at [place] - 2 x daily - 7 x weekly - 10 reps - 5 seconds hold

## 2021-04-15 NOTE — Therapy (Signed)
[facility], [place], [address] Phone: [phone]   Fax:  [phone]  Physical Therapy Evaluation  Patient Details  Name: Cory Moore MRN: [id number] Date of Birth: Jan 25, 1961 Referring Provider (PT): Dr. Lorin Mercy   Encounter Date: 04/15/2021   PT End of Session - 04/15/21 1025    Visit Number 1    Number of Visits 13    Date for PT Re-Evaluation 06/17/21    Authorization Type UHC    Progress Note Due on Visit 10    PT Start Time 1015    PT Stop Time 1100    PT Time Calculation (min) 45 min    Activity Tolerance Patient tolerated treatment well    Behavior During Therapy [facility] for tasks assessed/performed           Past Medical History:  Diagnosis Date  . Anxiety    a lot of stressors. panic attacks in younger years.  . ED (erectile dysfunction)   . GERD (gastroesophageal reflux disease)    very mild  . Hyperlipidemia   . Hypothyroidism   . Thyroid disease   . Tobacco abuse     Past Surgical History:  Procedure Laterality Date  . APPENDECTOMY  1998  . COLONOSCOPY    . HERNIA REPAIR  2003   right inguinal  . LUMBAR LAMINECTOMY/DECOMPRESSION MICRODISCECTOMY N/A 01/07/2021   Procedure: Lumbar three-lumbar four DECOMPRESSION  and  LEFT MICRODISCECTOMY;  Surgeon: Marybelle Killings, MD;  Location: [facility];  Service: Orthopedics;  Laterality: N/A;  . POLYPECTOMY      There were no vitals filed for this visit.    Subjective Assessment - 04/15/21 1018    Subjective Pt s/p lumbar decompression of spinal cord L3-4 on 01/07/2021. Pt reporting varying progress where somedays are better than others. Pt reporting 7/10 pain this morning, Pt took ibrophen when has dropped his pain to 3/10. Pt reproting transitions are painful, sit to stand and rolling in bed. Pt using pillows for support while sleeping.    Pertinent History anxiety, hyperlipidemia, hypothyrodism    How long can you sit comfortably? 30 minutes    Diagnostic  tests X-ray    Patient Stated Goals be able to kayak, walk without pain    Currently in Pain? Yes    Pain Score 3     Pain Location Back    Pain Orientation Lower    Pain Descriptors / Indicators Aching    Pain Type Surgical pain;Chronic pain    Pain Onset More than a month ago    Pain Frequency Intermittent    Aggravating Factors  transitions, sleeping    Pain Relieving Factors changing positions, pain meds both over the counter and prescription    Effect of Pain on Daily Activities unable to enjoy recreational activities, difficulty lifting and bending              [facility] PT Assessment - 04/15/21 0001      Assessment   Medical Diagnosis L3-4, lumbar decompression Z98.890,    Referring Provider (PT) Dr. Lorin Mercy    Onset Date/Surgical Date 01/07/21    Hand Dominance Right    Prior Therapy no      Precautions   Precautions None      Balance Screen   Has the patient fallen in the past 6 months No    Is the patient reluctant to leave their home because of a fear of falling?  No      Home  [place] residence    Living Arrangements Spouse/significant other    Type of [place] to enter    Entrance Stairs-Number of Steps 5    Entrance Stairs-Rails Right    Home Layout Two level    Alternate Level Stairs-Rails Left    Additional Comments office on 2nd level      Prior Function   Level of Independence Independent    Vocation Full time employment    Vocation Requirements works for [employer], works from home, sitting at [employer], walking      Cognition   Overall Cognitive Status Within Functional Limits for tasks assessed      Observation/Other Assessments   Focus on Therapeutic Outcomes (FOTO)  44 (predicted 61)      Posture/Postural Control   Posture/Postural Control Postural limitations    Postural Limitations Rounded Shoulders;Forward head    Posture Comments pt is 6/4"      ROM / Strength    AROM / PROM / Strength Strength;AROM      AROM   Overall AROM Comments standing    AROM Assessment Site Lumbar;Hip    Right/Left Hip Right;Left    Right Hip Flexion 110    Right Hip External Rotation  60    Right Hip ABduction 50    Left Hip Flexion 106    Left Hip External Rotation  55    Left Hip ABduction 50    Lumbar Flexion 36 degrees    Lumbar Extension 10    Lumbar - Right Side Bend 40    Lumbar - Left Side Bend 32    Lumbar - Right Rotation WFL    Lumbar - Left Rotation WFL   increased pain     Strength   Overall Strength Deficits    Strength Assessment Site Hip;Knee    Right/Left Hip Right;Left    Right Hip Flexion 4+/5    Right Hip ABduction 4+/5    Right Hip ADduction 4+/5    Left Hip Flexion 4-/5    Left Hip ABduction 4-/5    Left Hip ADduction 4-/5      Flexibility   Soft Tissue Assessment /Muscle Length yes    Hamstrings right: 55 degrees left: 42 degrees   in supine with opposite knee bent     Palpation   Palpation comment TTP: left QL and lumbar paraspinals      Transfers   Five time sit to stand comments  23 seconds single UE support      Ambulation/Gait   Gait Comments Pt amb with no device with antalgic gait with decreased trunk rotation and wide BOS with decresaed foot clearance bilaterally                      Objective measurements completed on examination: See above findings.       [employer] Adult PT Treatment/Exercise - 04/15/21 0001      Exercises   Exercises Lumbar      Lumbar Exercises: Stretches   Active Hamstring Stretch Right;Left;3 reps;20 seconds    Standing Extension 3 reps;5 seconds    Other Lumbar Stretch Exercise trunk rotation x 3 bilateral directions holding 20 seconds each    Other Lumbar Stretch Exercise QL stretch in door way x 2 each direction hodling 10 seconds      Lumbar Exercises: Supine   Pelvic Tilt Limitations attempted but pt  having difficullty with technique and pelvic dissociation                   PT Education - 04/15/21 1140    Education Details PT POC, HEP    Person(s) Educated Patient    Methods Explanation;Demonstration;Handout;Verbal cues;Tactile cues    Comprehension Returned demonstration;Verbalized understanding;Need further instruction            PT Short Term Goals - 04/15/21 1143      PT SHORT TERM GOAL #1   Title Pt will be independent in his initial HEP.    Time 3    Period Weeks    Status New    Target Date 05/08/21             PT Long Term Goals - 04/15/21 1148      PT LONG TERM GOAL #1   Title Pt will be independent in his advaced HEP.    Time 6    Period Weeks    Status New    Target Date 05/29/21      PT LONG TERM GOAL #2   Title Pt will improve his 5 time sit to stand to </= 14 seconds with no UE support.    Time 6    Period Weeks    Status New    Target Date 05/29/21      PT LONG TERM GOAL #3   Title Pt will improve bilateral hip strength to >/= 5/5 to improve functional mobility.    Time 6    Period Weeks    Status New    Target Date 05/29/21      PT LONG TERM GOAL #4   Title Pt will be able to lift 25# using bilateral UE's overhead with no pain reported using correct body mechanics.    Time 6    Period Weeks    Status New    Target Date 05/29/21      PT LONG TERM GOAL #5   Title Pt will be able to perform supine to sit and sit to supine with no pain reported.    Time 6    Period Weeks    Status New      PT LONG TERM GOAL #6   Title Pt will improve FOTO to >/= 61%.    Time 6    Period Weeks    Status New    Target Date 05/29/21                  Plan - 04/15/21 1205    Clinical Impression Statement Pt arriving today for PT evaluation s/p lumbar decompression of L3-4 on 01/07/2021. Pt reporting intermittent pain periods since surgery and stiffness. Pt still struggling with bed mobility, sleep, squatting, lifting, and bedning. Pt wants to return to kayaking and needs to be able to lift his kayak  off the top of his vehicle and get in and out of it safely. Pt presenting with limited ROM and weakness noted in bilateral hips. Skilled PT needed to address pt's impairments with below interventions.    Personal Factors and Comorbidities Comorbidity 3+    Comorbidities anxiety, hyperlipidemia, hyppothyrodism, thyroid disease, s/p lumbar decompression 01/07/2021    Examination-Activity Limitations Lift;Stand;Squat;Stairs;Bed Mobility;Carry;Transfers;Reach Overhead    Examination-Participation Restrictions Community Activity;Other;Yard Work    Stability/Clinical Decision Making Stable/Uncomplicated    Clinical Decision Making Low    Rehab Potential Good    PT Frequency 2x / week    PT Duration  6 weeks    PT Treatment/Interventions ADLs/Self Care Home Management;Electrical Stimulation;Cryotherapy;Iontophoresis 4mg /ml Dexamethasone;Traction;Moist Heat;Ultrasound;Balance training;Therapeutic exercise;Therapeutic activities;Functional mobility training;Stair training;Gait training;Neuromuscular re-education;Patient/family education;Passive range of motion;Manual techniques;Taping;Dry needling    PT Next Visit Plan begin on Nustep, lumbar stretching, ROM, hamstring stretches, sit to stand, hip strengthening    PT Home Exercise Plan Access Code: [email or web address]    Consulted and Agree with Plan of Care Patient           Patient will benefit from skilled therapeutic intervention in order to improve the following deficits and impairments:  Pain,Decreased strength,Decreased mobility,Postural dysfunction,Decreased balance,Impaired flexibility,Decreased activity tolerance,Difficulty walking  Visit Diagnosis: Acute bilateral low back pain without sciatica  Pain in left hip  Difficulty in walking, not elsewhere classified  Muscle weakness (generalized)     Problem List Patient Active Problem List   Diagnosis Date Noted  . Status post lumbar spine surgery for decompression of spinal cord 02/17/2021  . L  01/09/2021  . Foot drop, left 01/05/2021  . B12 deficiency 09/10/2019  . History of adenomatous polyp of colon 03/24/2018  . Former smoker 12/12/2012  . ERECTILE DYSFUNCTION 10/03/2008  . Hypothyroidism 10/02/2007  . Hyperlipidemia 10/02/2007    Oretha Caprice, PT, MPT 04/15/2021, 12:23 PM  [facility] [address] [place], [place], [address] Phone: [phone]   Fax:  [phone]  Name: Cory Moore MRN: [id number] Date of Birth: [birth year]

## 2021-04-22 ENCOUNTER — Ambulatory Visit (INDEPENDENT_AMBULATORY_CARE_PROVIDER_SITE_OTHER): Payer: 59 | Admitting: Rehabilitative and Restorative Service Providers"

## 2021-04-22 ENCOUNTER — Encounter: Payer: Self-pay | Admitting: Rehabilitative and Restorative Service Providers"

## 2021-04-22 ENCOUNTER — Other Ambulatory Visit: Payer: Self-pay

## 2021-04-22 DIAGNOSIS — M25552 Pain in left hip: Secondary | ICD-10-CM | POA: Diagnosis not present

## 2021-04-22 DIAGNOSIS — M545 Low back pain, unspecified: Secondary | ICD-10-CM | POA: Diagnosis not present

## 2021-04-22 DIAGNOSIS — R262 Difficulty in walking, not elsewhere classified: Secondary | ICD-10-CM

## 2021-04-22 DIAGNOSIS — M6281 Muscle weakness (generalized): Secondary | ICD-10-CM

## 2021-04-22 NOTE — Therapy (Signed)
[place], [place], [address] Phone: [phone]   Fax:  [phone]  Physical Therapy Treatment  Patient Details  Name: Cory Moore MRN: [id number] Date of Birth: Nov 16, 1960 Referring Provider (PT): Dr. Lorin Mercy   Encounter Date: 04/22/2021   PT End of Session - 04/22/21 0930    Visit Number 2    Number of Visits 13    Date for PT Re-Evaluation 06/17/21    Authorization Type UHC    Progress Note Due on Visit 10    PT Start Time 0930    PT Stop Time 1010    PT Time Calculation (min) 40 min    Activity Tolerance Patient tolerated treatment well    Behavior During Therapy [facility] for tasks assessed/performed           Past Medical History:  Diagnosis Date  . Anxiety    a lot of stressors. panic attacks in younger years.  . ED (erectile dysfunction)   . GERD (gastroesophageal reflux disease)    very mild  . Hyperlipidemia   . Hypothyroidism   . Thyroid disease   . Tobacco abuse     Past Surgical History:  Procedure Laterality Date  . APPENDECTOMY  1998  . COLONOSCOPY    . HERNIA REPAIR  2003   right inguinal  . LUMBAR LAMINECTOMY/DECOMPRESSION MICRODISCECTOMY N/A 01/07/2021   Procedure: Lumbar three-lumbar four DECOMPRESSION  and  LEFT MICRODISCECTOMY;  Surgeon: Marybelle Killings, MD;  Location: [facility];  Service: Orthopedics;  Laterality: N/A;  . POLYPECTOMY      There were no vitals filed for this visit.   Subjective Assessment - 04/22/21 0934    Subjective Pt. indicated getting up is improving slightly in transfers.  Pt. stated having pain in Lt hip and also noticing sitting causes some Lt groin pain.    Pertinent History anxiety, hyperlipidemia, hypothyrodism    How long can you sit comfortably? 30 minutes    Diagnostic tests X-ray    Patient Stated Goals be able to kayak, walk without pain    Currently in Pain? Yes    Pain Score 4     Pain Location Hip    Pain Orientation Left    Pain Descriptors /  Indicators Aching;Sore    Pain Type Chronic pain;Surgical pain    Pain Onset More than a month ago    Pain Frequency Intermittent    Aggravating Factors  sitting prolonged, transfers    Pain Relieving Factors medicine helps, changing positioning.                             [facility] Adult PT Treatment/Exercise - 04/22/21 0001      Lumbar Exercises: Stretches   Lower Trunk Rotation 3 reps   15 seconds x 3 bilateral   Piriformis Stretch 3 reps;Left   15 seconds x 3 (figure 4 flexion stretching)     Lumbar Exercises: Aerobic   Nustep Lvl 5 10 mins UE/LE      Lumbar Exercises: Supine   Ab Set 5 seconds;10 reps    Bridge 10 reps                    PT Short Term Goals - 04/22/21 0936      PT SHORT TERM GOAL #1   Title Pt will be independent in his initial HEP.    Time 3    Period Weeks  Status On-going    Target Date 05/08/21             PT Long Term Goals - 04/15/21 1148      PT LONG TERM GOAL #1   Title Pt will be independent in his advaced HEP.    Time 6    Period Weeks    Status New    Target Date 05/29/21      PT LONG TERM GOAL #2   Title Pt will improve his 5 time sit to stand to </= 14 seconds with no UE support.    Time 6    Period Weeks    Status New    Target Date 05/29/21      PT LONG TERM GOAL #3   Title Pt will improve bilateral hip strength to >/= 5/5 to improve functional mobility.    Time 6    Period Weeks    Status New    Target Date 05/29/21      PT LONG TERM GOAL #4   Title Pt will be able to lift 25# using bilateral UE's overhead with no pain reported using correct body mechanics.    Time 6    Period Weeks    Status New    Target Date 05/29/21      PT LONG TERM GOAL #5   Title Pt will be able to perform supine to sit and sit to supine with no pain reported.    Time 6    Period Weeks    Status New      PT LONG TERM GOAL #6   Title Pt will improve FOTO to >/= 61%.    Time 6    Period Weeks    Status  New    Target Date 05/29/21                 Plan - 04/22/21 0949    Clinical Impression Statement Reviewed HEP c some cues for symptom management in movements.  Pt. to continue to benefit from intervention focused on improved lumbar/hip mobility and strengthening to improve functional movement capacity.    Personal Factors and Comorbidities Comorbidity 3+    Comorbidities anxiety, hyperlipidemia, hyppothyrodism, thyroid disease, s/p lumbar decompression 01/07/2021    Examination-Activity Limitations Lift;Stand;Squat;Stairs;Bed Mobility;Carry;Transfers;Reach Overhead    Examination-Participation Restrictions Community Activity;Other;Yard Work    Stability/Clinical Decision Making Stable/Uncomplicated    Rehab Potential Good    PT Frequency 2x / week    PT Duration 6 weeks    PT Treatment/Interventions ADLs/Self Care Home Management;Electrical Stimulation;Cryotherapy;Iontophoresis 4mg /ml Dexamethasone;Traction;Moist Heat;Ultrasound;Balance training;Therapeutic exercise;Therapeutic activities;Functional mobility training;Stair training;Gait training;Neuromuscular re-education;Patient/family education;Passive range of motion;Manual techniques;Taping;Dry needling    PT Next Visit Plan Continue progression of supine/table based hip/abdominal strengthening/mobility    PT Home Exercise Plan Access Code: [email or web address]    Consulted and Agree with Plan of Care Patient           Patient will benefit from skilled therapeutic intervention in order to improve the following deficits and impairments:  Pain,Decreased strength,Decreased mobility,Postural dysfunction,Decreased balance,Impaired flexibility,Decreased activity tolerance,Difficulty walking  Visit Diagnosis: Acute bilateral low back pain without sciatica  Pain in left hip  Difficulty in walking, not elsewhere classified  Muscle weakness (generalized)     Problem List Patient Active Problem List   Diagnosis Date Noted  . Status post  lumbar spine surgery for decompression of spinal cord 02/17/2021  . L 01/09/2021  . Foot drop, left 01/05/2021  . B12 deficiency 09/10/2019  . History  of adenomatous polyp of colon 03/24/2018  . Former smoker 12/12/2012  . ERECTILE DYSFUNCTION 10/03/2008  . Hypothyroidism 10/02/2007  . Hyperlipidemia 10/02/2007    Scot Jun, PT, DPT, OCS, ATC 04/22/21  10:02 AM    [facility] [address] [place], [place], [address] Phone: [phone]   Fax:  [phone]  Name: DELON REVELO MRN: [id number] Date of Birth: 1961-04-30

## 2021-04-23 ENCOUNTER — Other Ambulatory Visit: Payer: Self-pay

## 2021-04-23 ENCOUNTER — Ambulatory Visit (INDEPENDENT_AMBULATORY_CARE_PROVIDER_SITE_OTHER): Payer: 59 | Admitting: *Deleted

## 2021-04-23 DIAGNOSIS — Z23 Encounter for immunization: Secondary | ICD-10-CM

## 2021-04-23 NOTE — Progress Notes (Signed)
Per orders of Dr. Yong Channel, injection of Shingrix 0.5 ml given IM by Anselmo Pickler, LPN in left deltoid. Patient tolerated injection well. Patient will make appointment for 6 months. Pt is scheduled for physical in Nov and will have 2nd shot at visit.

## 2021-04-24 ENCOUNTER — Ambulatory Visit (INDEPENDENT_AMBULATORY_CARE_PROVIDER_SITE_OTHER): Payer: 59 | Admitting: Rehabilitative and Restorative Service Providers"

## 2021-04-24 ENCOUNTER — Encounter: Payer: Self-pay | Admitting: Rehabilitative and Restorative Service Providers"

## 2021-04-24 DIAGNOSIS — M545 Low back pain, unspecified: Secondary | ICD-10-CM

## 2021-04-24 DIAGNOSIS — M6281 Muscle weakness (generalized): Secondary | ICD-10-CM

## 2021-04-24 DIAGNOSIS — R262 Difficulty in walking, not elsewhere classified: Secondary | ICD-10-CM | POA: Diagnosis not present

## 2021-04-24 DIAGNOSIS — M25552 Pain in left hip: Secondary | ICD-10-CM

## 2021-04-24 NOTE — Therapy (Signed)
[place], [place], [address] Phone: [phone]   Fax:  [phone]  Physical Therapy Treatment  Patient Details  Name: Cory Moore MRN: [id number] Date of Birth: 05/02/1961 Referring Provider (PT): Dr. Lorin Mercy   Encounter Date: 04/24/2021   PT End of Session - 04/24/21 0933     Visit Number 3    Number of Visits 13    Date for PT Re-Evaluation 06/17/21    Authorization Type U[employer]    Progress Note Due on Visit 10    PT Start Time 0929    PT Stop Time 1009    PT Time Calculation (min) 40 min    Activity Tolerance Patient tolerated treatment well    Behavior During Therapy [facility] for tasks assessed/performed             Past Medical History:  Diagnosis Date   Anxiety    a lot of stressors. panic attacks in younger years.   ED (erectile dysfunction)    GERD (gastroesophageal reflux disease)    very mild   Hyperlipidemia    Hypothyroidism    Thyroid disease    Tobacco abuse     Past Surgical History:  Procedure Laterality Date   [place]     HERNIA REPAIR  2003   right inguinal   LUMBAR LAMINECTOMY/DECOMPRESSION MICRODISCECTOMY N/A 01/07/2021   Procedure: Lumbar three-lumbar four DECOMPRESSION  and  LEFT MICRODISCECTOMY;  Surgeon: Marybelle Killings, MD;  Location: [facility];  Service: Orthopedics;  Laterality: N/A;   POLYPECTOMY      There were no vitals filed for this visit.   Subjective Assessment - 04/24/21 0934     Subjective Pt. indicated doing alright today. Pt. stated shingles shot yesterday.  Pt. stated feeling "usual " ache    Pertinent History anxiety, hyperlipidemia, hypothyrodism    How long can you sit comfortably? 30 minutes    Diagnostic tests X-ray    Patient Stated Goals be able to kayak, walk without pain    Currently in Pain? Yes    Pain Score 3     Pain Location Hip    Pain Orientation Left    Pain Descriptors / Indicators Aching;Sore    Pain Type Chronic  pain;Surgical pain    Pain Onset More than a month ago    Pain Frequency Intermittent    Aggravating Factors  sitting prolonged, transfers still                               [facility] Adult PT Treatment/Exercise - 04/24/21 0001       Lumbar Exercises: Stretches   Lower Trunk Rotation 3 reps   15 seconds bilateral   Piriformis Stretch 3 reps   15 seconds x 3     Lumbar Exercises: Aerobic   Nustep Lvl 5 15 mins UE/LE      Lumbar Exercises: Standing   Row Both   2 x 20 green   Theraband Level (Row) Level 3 (Green)    Shoulder Extension Both   2 x 10   Theraband Level (Shoulder Extension) Level 3 (Green)      Lumbar Exercises: Supine   Bridge 20 reps;Compliant                      PT Short Term Goals - 04/22/21 0936       PT SHORT TERM  GOAL #1   Title Pt will be independent in his initial HEP.    Time 3    Period Weeks    Status On-going    Target Date 05/08/21               PT Long Term Goals - 04/15/21 1148       PT LONG TERM GOAL #1   Title Pt will be independent in his advaced HEP.    Time 6    Period Weeks    Status New    Target Date 05/29/21      PT LONG TERM GOAL #2   Title Pt will improve his 5 time sit to stand to </= 14 seconds with no UE support.    Time 6    Period Weeks    Status New    Target Date 05/29/21      PT LONG TERM GOAL #3   Title Pt will improve bilateral hip strength to >/= 5/5 to improve functional mobility.    Time 6    Period Weeks    Status New    Target Date 05/29/21      PT LONG TERM GOAL #4   Title Pt will be able to lift 25# using bilateral UE's overhead with no pain reported using correct body mechanics.    Time 6    Period Weeks    Status New    Target Date 05/29/21      PT LONG TERM GOAL #5   Title Pt will be able to perform supine to sit and sit to supine with no pain reported.    Time 6    Period Weeks    Status New      PT LONG TERM GOAL #6   Title Pt will improve FOTO  to >/= 61%.    Time 6    Period Weeks    Status New    Target Date 05/29/21                   Plan - 04/24/21 1004     Clinical Impression Statement Continued with progression of mobility gains in stretching intervention, aerobic intervention and beginning of core activation intervention.   Pt. to benefit from continued skilled PT services.    Personal Factors and Comorbidities Comorbidity 3+    Comorbidities anxiety, hyperlipidemia, hyppothyrodism, thyroid disease, s/p lumbar decompression 01/07/2021    Examination-Activity Limitations Lift;Stand;Squat;Stairs;Bed Mobility;Carry;Transfers;Reach Overhead    Examination-Participation Restrictions Community Activity;Other;Yard Work    Stability/Clinical Decision Making Stable/Uncomplicated    Rehab Potential Good    PT Frequency 2x / week    PT Duration 6 weeks    PT Treatment/Interventions ADLs/Self Care Home Management;Electrical Stimulation;Cryotherapy;Iontophoresis 4mg /ml Dexamethasone;Traction;Moist Heat;Ultrasound;Balance training;Therapeutic exercise;Therapeutic activities;Functional mobility training;Stair training;Gait training;Neuromuscular re-education;Patient/family education;Passive range of motion;Manual techniques;Taping;Dry needling    PT Next Visit Plan Continue progression of supine/table based hip/abdominal strengthening/mobility as tolerated    PT Home Exercise Plan Access Code: [email or web address]    Consulted and Agree with Plan of Care Patient             Patient will benefit from skilled therapeutic intervention in order to improve the following deficits and impairments:  Pain, Decreased strength, Decreased mobility, Postural dysfunction, Decreased balance, Impaired flexibility, Decreased activity tolerance, Difficulty walking  Visit Diagnosis: Acute bilateral low back pain without sciatica  Pain in left hip  Difficulty in walking, not elsewhere classified  Muscle weakness (generalized)     Problem  List  Patient Active Problem List   Diagnosis Date Noted   Status post lumbar spine surgery for decompression of spinal cord 02/17/2021   L 01/09/2021   Foot drop, left 01/05/2021   B12 deficiency 09/10/2019   History of adenomatous polyp of colon 03/24/2018   Former smoker 12/12/2012   ERECTILE DYSFUNCTION 10/03/2008   Hypothyroidism 10/02/2007   Hyperlipidemia 10/02/2007   Scot Jun, PT, DPT, OCS, ATC 04/24/21  10:13 AM    [facility] [address] [place], [place], [address] Phone: [phone]   Fax:  [phone]  Name: Cory Moore MRN: [id number] Date of Birth: [birth year]

## 2021-04-24 NOTE — Progress Notes (Signed)
I have reviewed and agree with note, evaluation, plan.   Mykenzi Vanzile, MD  

## 2021-04-28 ENCOUNTER — Other Ambulatory Visit: Payer: Self-pay

## 2021-04-28 ENCOUNTER — Ambulatory Visit (INDEPENDENT_AMBULATORY_CARE_PROVIDER_SITE_OTHER): Payer: 59 | Admitting: Physical Therapy

## 2021-04-28 ENCOUNTER — Encounter: Payer: Self-pay | Admitting: Physical Therapy

## 2021-04-28 DIAGNOSIS — R262 Difficulty in walking, not elsewhere classified: Secondary | ICD-10-CM

## 2021-04-28 DIAGNOSIS — M545 Low back pain, unspecified: Secondary | ICD-10-CM | POA: Diagnosis not present

## 2021-04-28 DIAGNOSIS — M6281 Muscle weakness (generalized): Secondary | ICD-10-CM

## 2021-04-28 DIAGNOSIS — M25552 Pain in left hip: Secondary | ICD-10-CM | POA: Diagnosis not present

## 2021-04-28 NOTE — Therapy (Signed)
[place], [place], [address] Phone: [phone]   Fax:  [phone]  Physical Therapy Treatment  Patient Details  Name: Cory Moore MRN: [id number] Date of Birth: 16-May-1961 Referring Provider (PT): Dr. Lorin Mercy   Encounter Date: 04/28/2021   PT End of Session - 04/28/21 1020     Visit Number 4    Number of Visits 13    Date for PT Re-Evaluation 06/17/21    Authorization Type UHC    Progress Note Due on Visit 10    PT Start Time 1015    PT Stop Time 1055    PT Time Calculation (min) 40 min    Activity Tolerance Patient tolerated treatment well    Behavior During Therapy [facility] for tasks assessed/performed             Past Medical History:  Diagnosis Date   Anxiety    a lot of stressors. panic attacks in younger years.   ED (erectile dysfunction)    GERD (gastroesophageal reflux disease)    very mild   Hyperlipidemia    Hypothyroidism    Thyroid disease    Tobacco abuse     Past Surgical History:  Procedure Laterality Date   [place]     HERNIA REPAIR  2003   right inguinal   LUMBAR LAMINECTOMY/DECOMPRESSION MICRODISCECTOMY N/A 01/07/2021   Procedure: Lumbar three-lumbar four DECOMPRESSION  and  LEFT MICRODISCECTOMY;  Surgeon: Marybelle Killings, MD;  Location: [facility];  Service: Orthopedics;  Laterality: N/A;   POLYPECTOMY      There were no vitals filed for this visit.   Subjective Assessment - 04/28/21 1018     Subjective Pt arriving today reporting 3/10 pain in his . Pt reporting more stiffness after walking a couple of blocks this morning.    Pertinent History anxiety, hyperlipidemia, hypothyrodism    How long can you sit comfortably? 30 minutes    Diagnostic tests X-ray    Patient Stated Goals be able to kayak, walk without pain    Currently in Pain? Yes    Pain Score 3     Pain Location Hip    Pain Orientation Left    Pain Descriptors / Indicators Sore    Pain Type  Chronic pain;Surgical pain    Pain Onset More than a month ago                               [facility] Adult PT Treatment/Exercise - 04/28/21 0001       Exercises   Exercises Lumbar      Lumbar Exercises: Stretches   Lower Trunk Rotation 3 reps   15 seconds bilateral   Standing Extension 10 seconds;5 reps    Piriformis Stretch 3 reps;20 seconds    Figure 4 Stretch 3 reps;20 seconds      Lumbar Exercises: Aerobic   Nustep Lvl 5 12 mins UE/LE      Lumbar Exercises: Standing   Row Both   2x15   Theraband Level (Row) Level 3 (Green)    Shoulder Extension Both   2x 15   Theraband Level (Shoulder Extension) Level 3 (Green)      Lumbar Exercises: Supine   Bridge --    Bridge with clamshell Compliant;15 reps;3 seconds                      PT  Short Term Goals - 04/28/21 1024       PT SHORT TERM GOAL #1   Title Pt will be independent in his initial HEP.    Time 3    Period Weeks    Status On-going               PT Long Term Goals - 04/28/21 1024       PT LONG TERM GOAL #1   Title Pt will be independent in his advaced HEP.    Status On-going      PT LONG TERM GOAL #2   Title Pt will improve his 5 time sit to stand to </= 14 seconds with no UE support.    Status On-going      PT LONG TERM GOAL #3   Title Pt will improve bilateral hip strength to >/= 5/5 to improve functional mobility.    Status On-going      PT LONG TERM GOAL #4   Title Pt will be able to lift 25# using bilateral UE's overhead with no pain reported using correct body mechanics.    Status On-going      PT LONG TERM GOAL #5   Title Pt will be able to perform supine to sit and sit to supine with no pain reported.    Status On-going      PT LONG TERM GOAL #6   Title Pt will improve FOTO to >/= 61%.                   Plan - 04/28/21 1022     Clinical Impression Statement Pt tolerating exercises well. 3/10 pain reported upon arrival. Pt progresing with  strengtheing for hip, back, and core. Pt requiring some verbal instructions for core activation. Pt reporting overall improvements in daily transitions since beginning therapy. Continue skilled PT to maximize funciton.    Personal Factors and Comorbidities Comorbidity 3+    Comorbidities anxiety, hyperlipidemia, hyppothyrodism, thyroid disease, s/p lumbar decompression 01/07/2021    Examination-Activity Limitations Lift;Stand;Squat;Stairs;Bed Mobility;Carry;Transfers;Reach Overhead    Examination-Participation Restrictions Community Activity;Other;Yard Work    Stability/Clinical Decision Making Stable/Uncomplicated    Rehab Potential Good    PT Frequency 2x / week    PT Duration 6 weeks    PT Treatment/Interventions ADLs/Self Care Home Management;Electrical Stimulation;Cryotherapy;Iontophoresis 4mg /ml Dexamethasone;Traction;Moist Heat;Ultrasound;Balance training;Therapeutic exercise;Therapeutic activities;Functional mobility training;Stair training;Gait training;Neuromuscular re-education;Patient/family education;Passive range of motion;Manual techniques;Taping;Dry needling    PT Next Visit Plan Continue progression of supine/table based hip/abdominal strengthening/mobility as tolerated    PT Home Exercise Plan Access Code: [email or web address]    Consulted and Agree with Plan of Care Patient             Patient will benefit from skilled therapeutic intervention in order to improve the following deficits and impairments:  Pain, Decreased strength, Decreased mobility, Postural dysfunction, Decreased balance, Impaired flexibility, Decreased activity tolerance, Difficulty walking  Visit Diagnosis: Acute bilateral low back pain without sciatica  Pain in left hip  Difficulty in walking, not elsewhere classified  Muscle weakness (generalized)     Problem List Patient Active Problem List   Diagnosis Date Noted   Status post lumbar spine surgery for decompression of spinal cord 02/17/2021   L  01/09/2021   Foot drop, left 01/05/2021   B12 deficiency 09/10/2019   History of adenomatous polyp of colon 03/24/2018   Former smoker 12/12/2012   ERECTILE DYSFUNCTION 10/03/2008   Hypothyroidism 10/02/2007   Hyperlipidemia 10/02/2007    Oretha Caprice, PT,  MPT 04/28/2021, 10:57 AM  [facility] [address] [place], [place], [address] Phone: [phone]   Fax:  [phone]  Name: Cory Moore MRN: [id number] Date of Birth: 08/15/1961

## 2021-04-30 ENCOUNTER — Encounter: Payer: Self-pay | Admitting: Rehabilitative and Restorative Service Providers"

## 2021-04-30 ENCOUNTER — Other Ambulatory Visit: Payer: Self-pay

## 2021-04-30 ENCOUNTER — Ambulatory Visit (INDEPENDENT_AMBULATORY_CARE_PROVIDER_SITE_OTHER): Payer: 59 | Admitting: Rehabilitative and Restorative Service Providers"

## 2021-04-30 DIAGNOSIS — M25552 Pain in left hip: Secondary | ICD-10-CM

## 2021-04-30 DIAGNOSIS — M545 Low back pain, unspecified: Secondary | ICD-10-CM | POA: Diagnosis not present

## 2021-04-30 DIAGNOSIS — R262 Difficulty in walking, not elsewhere classified: Secondary | ICD-10-CM

## 2021-04-30 DIAGNOSIS — M6281 Muscle weakness (generalized): Secondary | ICD-10-CM

## 2021-04-30 NOTE — Therapy (Signed)
[place], [place], [address] Phone: [phone]   Fax:  [phone]  Physical Therapy Treatment  Patient Details  Name: Cory Moore MRN: [id number] Date of Birth: September 11, 1961 Referring Provider (PT): Dr. Lorin Mercy   Encounter Date: 04/30/2021   PT End of Session - 04/30/21 0920     Visit Number 5    Number of Visits 13    Date for PT Re-Evaluation 06/17/21    Authorization Type UHC    Progress Note Due on Visit 10    PT Start Time 0921    PT Stop Time 1001    PT Time Calculation (min) 40 min    Activity Tolerance Patient tolerated treatment well    Behavior During Therapy [facility] for tasks assessed/performed             Past Medical History:  Diagnosis Date   Anxiety    a lot of stressors. panic attacks in younger years.   ED (erectile dysfunction)    GERD (gastroesophageal reflux disease)    very mild   Hyperlipidemia    Hypothyroidism    Thyroid disease    Tobacco abuse     Past Surgical History:  Procedure Laterality Date   [place]     HERNIA REPAIR  2003   right inguinal   LUMBAR LAMINECTOMY/DECOMPRESSION MICRODISCECTOMY N/A 01/07/2021   Procedure: Lumbar three-lumbar four DECOMPRESSION  and  LEFT MICRODISCECTOMY;  Surgeon: Marybelle Killings, MD;  Location: [facility];  Service: Orthopedics;  Laterality: N/A;   POLYPECTOMY      There were no vitals filed for this visit.   Subjective Assessment - 04/30/21 0923     Subjective Pt. stated pain level is pretty low.  Getting some progress in ability to get shoes on but still limited.    Pertinent History anxiety, hyperlipidemia, hypothyrodism    How long can you sit comfortably? 30 minutes    Diagnostic tests X-ray    Patient Stated Goals be able to kayak, walk without pain    Currently in Pain? Yes    Pain Score 2     Pain Location Hip    Pain Orientation Left   groin, posterior hip   Pain Descriptors / Indicators Sore    Pain  Onset More than a month ago    Pain Frequency Intermittent    Aggravating Factors  shoe on /off    Pain Relieving Factors stretching helps                [facility] PT Assessment - 04/30/21 0001       Assessment   Medical Diagnosis L3-4, lumbar decompression Z98.890,    Referring Provider (PT) Dr. Lorin Mercy    Onset Date/Surgical Date 01/07/21    Hand Dominance Right                           [facility] Adult PT Treatment/Exercise - 04/30/21 0001       Lumbar Exercises: Stretches   Other Lumbar Stretch Exercise thomas stretch bilateral 30 sec x 3 bilateral      Lumbar Exercises: Aerobic   Recumbent Bike Lvl 4 10 mins, seat 10      Lumbar Exercises: Machines for Strengthening   Leg Press Double leg 2 x 15 75 lbs slow eccentric lowering      Lumbar Exercises: Sidelying   Clam Both   3 x 15 bilateral  Lumbar Exercises: Prone   Other Prone Lumbar Exercises plank to fatigue 15 seconds x 5      Lumbar Exercises: Quadruped   Opposite Arm/Leg Raise 10 reps;Left arm/Right leg;Right arm/Left leg   2-3 sec hold     Manual Therapy   Manual therapy comments percussive device to Lt glute max, med/min, Lt QL                      PT Short Term Goals - 04/28/21 1024       PT SHORT TERM GOAL #1   Title Pt will be independent in his initial HEP.    Time 3    Period Weeks    Status On-going               PT Long Term Goals - 04/28/21 1024       PT LONG TERM GOAL #1   Title Pt will be independent in his advaced HEP.    Status On-going      PT LONG TERM GOAL #2   Title Pt will improve his 5 time sit to stand to </= 14 seconds with no UE support.    Status On-going      PT LONG TERM GOAL #3   Title Pt will improve bilateral hip strength to >/= 5/5 to improve functional mobility.    Status On-going      PT LONG TERM GOAL #4   Title Pt will be able to lift 25# using bilateral UE's overhead with no pain reported using correct body mechanics.     Status On-going      PT LONG TERM GOAL #5   Title Pt will be able to perform supine to sit and sit to supine with no pain reported.    Status On-going      PT LONG TERM GOAL #6   Title Pt will improve FOTO to >/= 61%.                   Plan - 04/30/21 0957     Clinical Impression Statement Incorporated more core stability intervention including quadruped reaching and prone plank activity c fatigue noted.  Soft tissue mobilization to Lt hip for improved mobility performed c good response in clinic.    Personal Factors and Comorbidities Comorbidity 3+    Comorbidities anxiety, hyperlipidemia, hyppothyrodism, thyroid disease, s/p lumbar decompression 01/07/2021    Examination-Activity Limitations Lift;Stand;Squat;Stairs;Bed Mobility;Carry;Transfers;Reach Overhead    Examination-Participation Restrictions Community Activity;Other;Yard Work    Stability/Clinical Decision Making Stable/Uncomplicated    Rehab Potential Good    PT Frequency 2x / week    PT Duration 6 weeks    PT Treatment/Interventions ADLs/Self Care Home Management;Electrical Stimulation;Cryotherapy;Iontophoresis 4mg /ml Dexamethasone;Traction;Moist Heat;Ultrasound;Balance training;Therapeutic exercise;Therapeutic activities;Functional mobility training;Stair training;Gait training;Neuromuscular re-education;Patient/family education;Passive range of motion;Manual techniques;Taping;Dry needling    PT Next Visit Plan Continue plank, quadruped reaches, LE strengthening.    PT Home Exercise Plan Access Code: [email or web address]    Consulted and Agree with Plan of Care Patient             Patient will benefit from skilled therapeutic intervention in order to improve the following deficits and impairments:  Pain, Decreased strength, Decreased mobility, Postural dysfunction, Decreased balance, Impaired flexibility, Decreased activity tolerance, Difficulty walking  Visit Diagnosis: Acute bilateral low back pain without  sciatica  Pain in left hip  Difficulty in walking, not elsewhere classified  Muscle weakness (generalized)     Problem List Patient Active Problem  List   Diagnosis Date Noted   Status post lumbar spine surgery for decompression of spinal cord 02/17/2021   L 01/09/2021   Foot drop, left 01/05/2021   B12 deficiency 09/10/2019   History of adenomatous polyp of colon 03/24/2018   Former smoker 12/12/2012   ERECTILE DYSFUNCTION 10/03/2008   Hypothyroidism 10/02/2007   Hyperlipidemia 10/02/2007   Scot Jun, PT, DPT, OCS, ATC 04/30/21  9:58 AM    [facility] [address] [place], [place], [address] Phone: [phone]   Fax:  [phone]  Name: Cory Moore MRN: [id number] Date of Birth: [birth year]

## 2021-05-05 ENCOUNTER — Ambulatory Visit (INDEPENDENT_AMBULATORY_CARE_PROVIDER_SITE_OTHER): Payer: 59 | Admitting: Physical Therapy

## 2021-05-05 ENCOUNTER — Encounter: Payer: Self-pay | Admitting: Physical Therapy

## 2021-05-05 ENCOUNTER — Other Ambulatory Visit: Payer: Self-pay

## 2021-05-05 DIAGNOSIS — M25552 Pain in left hip: Secondary | ICD-10-CM | POA: Diagnosis not present

## 2021-05-05 DIAGNOSIS — R262 Difficulty in walking, not elsewhere classified: Secondary | ICD-10-CM | POA: Diagnosis not present

## 2021-05-05 DIAGNOSIS — M545 Low back pain, unspecified: Secondary | ICD-10-CM

## 2021-05-05 DIAGNOSIS — M6281 Muscle weakness (generalized): Secondary | ICD-10-CM | POA: Diagnosis not present

## 2021-05-05 NOTE — Therapy (Signed)
[place], [place], [address] Phone: [phone]   Fax:  [phone]  Physical Therapy Treatment  Patient Details  Name: Cory Moore MRN: [id number] Date of Birth: [birth year] Referring Provider (PT): Dr. Lorin Mercy   Encounter Date: 05/05/2021   PT End of Session - 05/05/21 1021     Visit Number 6    Number of Visits 13    Date for PT Re-Evaluation 06/17/21    Authorization Type UHC    Progress Note Due on Visit 10    PT Start Time 1015    PT Stop Time 1055    PT Time Calculation (min) 40 min    Activity Tolerance Patient tolerated treatment well    Behavior During Therapy [facility] for tasks assessed/performed             Past Medical History:  Diagnosis Date   Anxiety    a lot of stressors. panic attacks in younger years.   ED (erectile dysfunction)    GERD (gastroesophageal reflux disease)    very mild   Hyperlipidemia    Hypothyroidism    Thyroid disease    Tobacco abuse     Past Surgical History:  Procedure Laterality Date   [place]     HERNIA REPAIR  2003   right inguinal   LUMBAR LAMINECTOMY/DECOMPRESSION MICRODISCECTOMY N/A 01/07/2021   Procedure: Lumbar three-lumbar four DECOMPRESSION  and  LEFT MICRODISCECTOMY;  Surgeon: Marybelle Killings, MD;  Location: [place];  Service: Orthopedics;  Laterality: N/A;   POLYPECTOMY      There were no vitals filed for this visit.   Subjective Assessment - 05/05/21 1019     Subjective Pt reporting pain level of 3/10 upon arrival with movement. Pt pointing to left low back.  Pt reported getting shoes on with increased pain level to 4/10.    Pertinent History anxiety, hyperlipidemia, hypothyrodism    How long can you sit comfortably? 30 minutes    Diagnostic tests X-ray    Patient Stated Goals be able to kayak, walk without pain    Currently in Pain? Yes    Pain Score 3     Pain Location Hip    Pain Orientation Left    Pain Descriptors /  Indicators Sore    Pain Type Chronic pain    Pain Onset More than a month ago                               [facility] Adult PT Treatment/Exercise - 05/05/21 0001       Exercises   Exercises Lumbar      Lumbar Exercises: Stretches   Piriformis Stretch 2 reps;30 seconds    Figure 4 Stretch 2 reps;30 seconds    Other Lumbar Stretch Exercise thomas stretch bilateral 30 sec x 3 bilateral      Lumbar Exercises: Aerobic   Recumbent Bike L4 x 9 minutes      Lumbar Exercises: Machines for Strengthening   Leg Press Double leg 2 x 15 87 lbs slow eccentric lowering      Lumbar Exercises: Sidelying   Clam Both   3 x 15 bilateral   Clam Limitations red theraband 2x15      Lumbar Exercises: Prone   Other Prone Lumbar Exercises plank to fatigue 15 seconds x 5      Lumbar Exercises: Quadruped   Opposite Arm/Leg Raise 10  reps;Left arm/Right leg;Right arm/Left leg   2-3 sec hold     Manual Therapy   Manual therapy comments self soft tissue mobs using a tennis ball for home use                      PT Short Term Goals - 05/05/21 1022       PT SHORT TERM GOAL #1   Title Pt will be independent in his initial HEP.    Status Achieved               PT Long Term Goals - 05/05/21 1022       PT LONG TERM GOAL #1   Title Pt will be independent in his advaced HEP.    Status On-going      PT LONG TERM GOAL #2   Title Pt will improve his 5 time sit to stand to </= 14 seconds with no UE support.    Status On-going      PT LONG TERM GOAL #3   Title Pt will improve bilateral hip strength to >/= 5/5 to improve functional mobility.    Status On-going      PT LONG TERM GOAL #4   Title Pt will be able to lift 25# using bilateral UE's overhead with no pain reported using correct body mechanics.    Status On-going      PT LONG TERM GOAL #5   Title Pt will be able to perform supine to sit and sit to supine with no pain reported.    Status On-going      PT  LONG TERM GOAL #6   Title Pt will improve FOTO to >/= 61%.    Status On-going                   Plan - 05/05/21 1023     Clinical Impression Statement Pt reporting over the weekend he was able to go on a 3 mile hike using walking sticks and taking it slow. Pt reporting this was his first time on inclines and declines. Pt reporting pain of 5/10 and increased fatigue. Pt reporting he is modifiying bending and picking up things from the ground by going down on one knee instead of bending with his back. Pt reporting on his trip he had to make frequent stops to get out of car and stretch and walk. Pt tolerating exercises well today cotninue toward plan of care progressing toward goals set.    Personal Factors and Comorbidities Comorbidity 3+    Comorbidities anxiety, hyperlipidemia, hyppothyrodism, thyroid disease, s/p lumbar decompression 01/07/2021    Examination-Activity Limitations Lift;Stand;Squat;Stairs;Bed Mobility;Carry;Transfers;Reach Overhead    Examination-Participation Restrictions Community Activity;Other;Yard Work    Stability/Clinical Decision Making Stable/Uncomplicated    Rehab Potential Good    PT Frequency 2x / week    PT Duration 6 weeks    PT Treatment/Interventions ADLs/Self Care Home Management;Electrical Stimulation;Cryotherapy;Iontophoresis 4mg /ml Dexamethasone;Traction;Moist Heat;Ultrasound;Balance training;Therapeutic exercise;Therapeutic activities;Functional mobility training;Stair training;Gait training;Neuromuscular re-education;Patient/family education;Passive range of motion;Manual techniques;Taping;Dry needling    PT Next Visit Plan Continue plank, quadruped reaches, LE strengthening.    PT Home Exercise Plan Access Code: [email or web address]    Consulted and Agree with Plan of Care Patient             Patient will benefit from skilled therapeutic intervention in order to improve the following deficits and impairments:  Pain, Decreased strength, Decreased  mobility, Postural dysfunction, Decreased balance, Impaired flexibility, Decreased activity tolerance, Difficulty  walking  Visit Diagnosis: Acute bilateral low back pain without sciatica  Pain in left hip  Difficulty in walking, not elsewhere classified  Muscle weakness (generalized)     Problem List Patient Active Problem List   Diagnosis Date Noted   Status post lumbar spine surgery for decompression of spinal cord 02/17/2021   L 01/09/2021   Foot drop, left 01/05/2021   B12 deficiency 09/10/2019   History of adenomatous polyp of colon 03/24/2018   Former smoker 12/12/2012   ERECTILE DYSFUNCTION 10/03/2008   Hypothyroidism 10/02/2007   Hyperlipidemia 10/02/2007    Oretha Caprice, PT, MPT 05/05/2021, 10:58 AM  [facility] [address] [place], [place], [address] Phone: [phone]   Fax:  [phone]  Name: Cory Moore MRN: [id number] Date of Birth: 1961/02/27

## 2021-05-07 ENCOUNTER — Ambulatory Visit (INDEPENDENT_AMBULATORY_CARE_PROVIDER_SITE_OTHER): Payer: 59 | Admitting: Rehabilitative and Restorative Service Providers"

## 2021-05-07 ENCOUNTER — Other Ambulatory Visit: Payer: Self-pay

## 2021-05-07 ENCOUNTER — Encounter: Payer: Self-pay | Admitting: Rehabilitative and Restorative Service Providers"

## 2021-05-07 DIAGNOSIS — M25552 Pain in left hip: Secondary | ICD-10-CM

## 2021-05-07 DIAGNOSIS — M6281 Muscle weakness (generalized): Secondary | ICD-10-CM | POA: Diagnosis not present

## 2021-05-07 DIAGNOSIS — R262 Difficulty in walking, not elsewhere classified: Secondary | ICD-10-CM | POA: Diagnosis not present

## 2021-05-07 DIAGNOSIS — M545 Low back pain, unspecified: Secondary | ICD-10-CM | POA: Diagnosis not present

## 2021-05-07 NOTE — Therapy (Signed)
[facility] [address] [place], [place], [address] Phone: [phone]   Fax:  [phone]  Physical Therapy Treatment  Patient Details  Name: RUFINO STAUP MRN: [id number] Date of Birth: [birth year] Referring Provider (PT): Dr. Lorin Mercy   Encounter Date: 05/07/2021   PT End of Session - 05/07/21 0924     Visit Number 7    Number of Visits 13    Date for PT Re-Evaluation 06/17/21    Authorization Type UHC    Progress Note Due on Visit 10    PT Start Time 0926    PT Stop Time 1005    PT Time Calculation (min) 39 min    Activity Tolerance Patient tolerated treatment well    Behavior During Therapy [facility] for tasks assessed/performed             Past Medical History:  Diagnosis Date   Anxiety    a lot of stressors. panic attacks in younger years.   ED (erectile dysfunction)    GERD (gastroesophageal reflux disease)    very mild   Hyperlipidemia    Hypothyroidism    Thyroid disease    Tobacco abuse     Past Surgical History:  Procedure Laterality Date   [place]     HERNIA REPAIR  2003   right inguinal   LUMBAR LAMINECTOMY/DECOMPRESSION MICRODISCECTOMY N/A 01/07/2021   Procedure: Lumbar three-lumbar four DECOMPRESSION  and  LEFT MICRODISCECTOMY;  Surgeon: Marybelle Killings, MD;  Location: [place];  Service: Orthopedics;  Laterality: N/A;   POLYPECTOMY      There were no vitals filed for this visit.   Subjective Assessment - 05/07/21 0930     Subjective Pt. indicated feeling some mild complaints in back upon arrival and felt like his walking seemed to making some progress.    Pertinent History anxiety, hyperlipidemia, hypothyrodism    How long can you sit comfortably? 30 minutes    Diagnostic tests X-ray    Patient Stated Goals be able to kayak, walk without pain    Currently in Pain? No/denies    Pain Score --   mild soreness   Pain Location Back    Pain Orientation Lower    Pain Descriptors /  Indicators Sore    Pain Type Chronic pain    Pain Onset More than a month ago    Pain Frequency Intermittent    Aggravating Factors  maybe with bridges    Pain Relieving Factors stretching and walking has gotten better                               [facility] Adult PT Treatment/Exercise - 05/07/21 0001       Lumbar Exercises: Stretches   Lower Trunk Rotation 3 reps   15 sec x 3 bilateral   Piriformis Stretch 3 reps;30 seconds;Left;Right    Figure 4 Stretch 30 seconds;3 reps   bilateral     Lumbar Exercises: Aerobic   Tread Mill 10 mins with speed 1.8-2.0 mph      Lumbar Exercises: Machines for Strengthening   Leg Press Double leg 3 x 10 93 lbs      Lumbar Exercises: Standing   Row Both   3 x 10   Theraband Level (Row) Level 3 (Green)    Shoulder Extension Both   3 x 10   Theraband Level (Shoulder Extension) Level 3 (Green)  PT Short Term Goals - 05/05/21 1022       PT SHORT TERM GOAL #1   Title Pt will be independent in his initial HEP.    Status Achieved               PT Long Term Goals - 05/05/21 1022       PT LONG TERM GOAL #1   Title Pt will be independent in his advaced HEP.    Status On-going      PT LONG TERM GOAL #2   Title Pt will improve his 5 time sit to stand to </= 14 seconds with no UE support.    Status On-going      PT LONG TERM GOAL #3   Title Pt will improve bilateral hip strength to >/= 5/5 to improve functional mobility.    Status On-going      PT LONG TERM GOAL #4   Title Pt will be able to lift 25# using bilateral UE's overhead with no pain reported using correct body mechanics.    Status On-going      PT LONG TERM GOAL #5   Title Pt will be able to perform supine to sit and sit to supine with no pain reported.    Status On-going      PT LONG TERM GOAL #6   Title Pt will improve FOTO to >/= 61%.    Status On-going                   Plan - 05/07/21 0955     Clinical  Impression Statement Continued incorporation of activity c lumbar stability/core activation focus to improve strength and endurance for return to recreational activity.  Hip complaints have made posiitive reduction as compared to previous visits.    Personal Factors and Comorbidities Comorbidity 3+    Comorbidities anxiety, hyperlipidemia, hyppothyrodism, thyroid disease, s/p lumbar decompression 01/07/2021    Examination-Activity Limitations Lift;Stand;Squat;Stairs;Bed Mobility;Carry;Transfers;Reach Overhead    Examination-Participation Restrictions Community Activity;Other;Yard Work    Stability/Clinical Decision Making Stable/Uncomplicated    Rehab Potential Good    PT Frequency 2x / week    PT Duration 6 weeks    PT Treatment/Interventions ADLs/Self Care Home Management;Electrical Stimulation;Cryotherapy;Iontophoresis 4mg /ml Dexamethasone;Traction;Moist Heat;Ultrasound;Balance training;Therapeutic exercise;Therapeutic activities;Functional mobility training;Stair training;Gait training;Neuromuscular re-education;Patient/family education;Passive range of motion;Manual techniques;Taping;Dry needling    PT Next Visit Plan Conitnue gains with activity c core activation mixed in.    PT Home Exercise Plan Access Code: [email or web address]    Consulted and Agree with Plan of Care Patient             Patient will benefit from skilled therapeutic intervention in order to improve the following deficits and impairments:  Pain, Decreased strength, Decreased mobility, Postural dysfunction, Decreased balance, Impaired flexibility, Decreased activity tolerance, Difficulty walking  Visit Diagnosis: Acute bilateral low back pain without sciatica  Pain in left hip  Difficulty in walking, not elsewhere classified  Muscle weakness (generalized)     Problem List Patient Active Problem List   Diagnosis Date Noted   Status post lumbar spine surgery for decompression of spinal cord 02/17/2021   L 01/09/2021    Foot drop, left 01/05/2021   B12 deficiency 09/10/2019   History of adenomatous polyp of colon 03/24/2018   Former smoker 12/12/2012   ERECTILE DYSFUNCTION 10/03/2008   Hypothyroidism 10/02/2007   Hyperlipidemia 10/02/2007    Scot Jun, PT, DPT, OCS, ATC 05/07/21  10:05 AM    [facility] [address]  [place], [place], [address] Phone: [phone]   Fax:  [phone]  Name: KARLA VINES MRN: [id number] Date of Birth: November 29, 1960

## 2021-05-11 ENCOUNTER — Other Ambulatory Visit: Payer: Self-pay

## 2021-05-11 ENCOUNTER — Ambulatory Visit (INDEPENDENT_AMBULATORY_CARE_PROVIDER_SITE_OTHER): Payer: 59 | Admitting: Physical Therapy

## 2021-05-11 ENCOUNTER — Encounter: Payer: Self-pay | Admitting: Physical Therapy

## 2021-05-11 DIAGNOSIS — M6281 Muscle weakness (generalized): Secondary | ICD-10-CM

## 2021-05-11 DIAGNOSIS — M25552 Pain in left hip: Secondary | ICD-10-CM

## 2021-05-11 DIAGNOSIS — M545 Low back pain, unspecified: Secondary | ICD-10-CM

## 2021-05-11 DIAGNOSIS — R262 Difficulty in walking, not elsewhere classified: Secondary | ICD-10-CM

## 2021-05-11 NOTE — Therapy (Signed)
[place], [place], [address] Phone: [phone]   Fax:  [phone]  Physical Therapy Treatment  Patient Details  Name: Cory Moore MRN: [id number] Date of Birth: [birth year] Referring Provider (PT): Dr. Lorin Mercy   Encounter Date: 05/11/2021   PT End of Session - 05/11/21 1200     Visit Number 8    Number of Visits 13    Date for PT Re-Evaluation 06/17/21    Authorization Type UHC    Progress Note Due on Visit 10    PT Start Time 1145    PT Stop Time 1225    PT Time Calculation (min) 40 min    Activity Tolerance Patient tolerated treatment well    Behavior During Therapy [facility] for tasks assessed/performed             Past Medical History:  Diagnosis Date   Anxiety    a lot of stressors. panic attacks in younger years.   ED (erectile dysfunction)    GERD (gastroesophageal reflux disease)    very mild   Hyperlipidemia    Hypothyroidism    Thyroid disease    Tobacco abuse     Past Surgical History:  Procedure Laterality Date   [place]     HERNIA REPAIR  2003   right inguinal   LUMBAR LAMINECTOMY/DECOMPRESSION MICRODISCECTOMY N/A 01/07/2021   Procedure: Lumbar three-lumbar four DECOMPRESSION  and  LEFT MICRODISCECTOMY;  Surgeon: Marybelle Killings, MD;  Location: [place];  Service: Orthopedics;  Laterality: N/A;   POLYPECTOMY      There were no vitals filed for this visit.   Subjective Assessment - 05/11/21 1159     Subjective Pt reporting 4/10 pain today in his left lower back.    Pertinent History anxiety, hyperlipidemia, hypothyrodism    How long can you sit comfortably? 30 minutes    Diagnostic tests X-ray    Patient Stated Goals be able to kayak, walk without pain    Currently in Pain? Yes    Pain Score 4     Pain Location Back    Pain Orientation Lower;Left    Pain Descriptors / Indicators Sore    Pain Type Chronic pain                                [facility] Adult PT Treatment/Exercise - 05/11/21 0001       Exercises   Exercises Lumbar      Lumbar Exercises: Stretches   Lower Trunk Rotation 3 reps   15 sec x 3 bilateral   Piriformis Stretch 3 reps;30 seconds;Left;Right    Figure 4 Stretch 30 seconds;3 reps   bilateral     Lumbar Exercises: Aerobic   Tread Mill 10 mins with speed 2.0- 2.3  mph      Lumbar Exercises: Machines for Strengthening   Leg Press Double leg 3 x 10 125 lbs, single leg press: 75 lbs x10    Other Lumbar Machine Exercise BATCA row using bilateral pulls with 15# on each 2x12, shoulder extension 10# using BATCA 2x10      Lumbar Exercises: Standing   Row --    Theraband Level (Row) --    Shoulder Extension --    Theraband Level (Shoulder Extension) --    Other Standing Lumbar Exercises sit to stand x 5 in 13 seconds No UE support      Lumbar  Exercises: Supine   Bridge with clamshell 15 reps;5 seconds;Limitations    Bridge with Ball Squeeze Limitations green theraband                      PT Short Term Goals - 05/05/21 1022       PT SHORT TERM GOAL #1   Title Pt will be independent in his initial HEP.    Status Achieved               PT Long Term Goals - 05/11/21 1210       PT LONG TERM GOAL #1   Title Pt will be independent in his advaced HEP.    Status On-going      PT LONG TERM GOAL #2   Title Pt will improve his 5 time sit to stand to </= 14 seconds with no UE support.    Baseline 13 seconds with no UE support on 05/11/2021.    Status Achieved      PT LONG TERM GOAL #3   Title Pt will improve bilateral hip strength to >/= 5/5 to improve functional mobility.    Status On-going      PT LONG TERM GOAL #4   Title Pt will be able to lift 25# using bilateral UE's overhead with no pain reported using correct body mechanics.    Status On-going      PT LONG TERM GOAL #5   Title Pt will be able to perform supine to sit and sit to supine  with no pain reported.    Baseline Pt reporting he still has to use his UE's to assist with supine to sit but pain is low if any    Status Partially Met      PT LONG TERM GOAL #6   Title Pt will improve FOTO to >/= 61%.    Status On-going                   Plan - 05/11/21 1203     Clinical Impression Statement Pt reporting going on a hike over the weekend with fatigue reported following. Pt reporting 4/10 pain today. Pt tolerated exercises well with mild discomfort with seated single knee to chest stretches. Continue skilled PT to maximze function.    Personal Factors and Comorbidities Comorbidity 3+    Comorbidities anxiety, hyperlipidemia, hyppothyrodism, thyroid disease, s/p lumbar decompression 01/07/2021    Examination-Activity Limitations Lift;Stand;Squat;Stairs;Bed Mobility;Carry;Transfers;Reach Overhead    Examination-Participation Restrictions Community Activity;Other;Yard Work    Stability/Clinical Decision Making Stable/Uncomplicated    Rehab Potential Good    PT Frequency 2x / week    PT Duration 6 weeks    PT Treatment/Interventions ADLs/Self Care Home Management;Electrical Stimulation;Cryotherapy;Iontophoresis 4mg /ml Dexamethasone;Traction;Moist Heat;Ultrasound;Balance training;Therapeutic exercise;Therapeutic activities;Functional mobility training;Stair training;Gait training;Neuromuscular re-education;Patient/family education;Passive range of motion;Manual techniques;Taping;Dry needling    PT Next Visit Plan Conitnue gains with activity c core activation mixed in.    PT Home Exercise Plan Access Code: [email or web address]    Consulted and Agree with Plan of Care Patient             Patient will benefit from skilled therapeutic intervention in order to improve the following deficits and impairments:  Pain, Decreased strength, Decreased mobility, Postural dysfunction, Decreased balance, Impaired flexibility, Decreased activity tolerance, Difficulty walking  Visit  Diagnosis: Acute bilateral low back pain without sciatica  Pain in left hip  Difficulty in walking, not elsewhere classified  Muscle weakness (generalized)     Problem List  Patient Active Problem List   Diagnosis Date Noted   Status post lumbar spine surgery for decompression of spinal cord 02/17/2021   L 01/09/2021   Foot drop, left 01/05/2021   B12 deficiency 09/10/2019   History of adenomatous polyp of colon 03/24/2018   Former smoker 12/12/2012   ERECTILE DYSFUNCTION 10/03/2008   Hypothyroidism 10/02/2007   Hyperlipidemia 10/02/2007    Oretha Caprice, PT, MPT 05/11/2021, 12:26 PM  [facility] [address] [place], [place], [address] Phone: [phone]   Fax:  [phone]  Name: Cory Moore MRN: [id number] Date of Birth: 1961/04/30

## 2021-05-13 ENCOUNTER — Ambulatory Visit (INDEPENDENT_AMBULATORY_CARE_PROVIDER_SITE_OTHER): Payer: 59 | Admitting: Physical Therapy

## 2021-05-13 ENCOUNTER — Other Ambulatory Visit: Payer: Self-pay

## 2021-05-13 ENCOUNTER — Encounter: Payer: Self-pay | Admitting: Physical Therapy

## 2021-05-13 DIAGNOSIS — M545 Low back pain, unspecified: Secondary | ICD-10-CM | POA: Diagnosis not present

## 2021-05-13 DIAGNOSIS — R262 Difficulty in walking, not elsewhere classified: Secondary | ICD-10-CM | POA: Diagnosis not present

## 2021-05-13 DIAGNOSIS — M6281 Muscle weakness (generalized): Secondary | ICD-10-CM

## 2021-05-13 DIAGNOSIS — M25552 Pain in left hip: Secondary | ICD-10-CM | POA: Diagnosis not present

## 2021-05-13 NOTE — Therapy (Signed)
[place], [place], [address] Phone: [phone]   Fax:  [phone]  Physical Therapy Treatment  Patient Details  Name: Cory Moore MRN: [id number] Date of Birth: 1961-05-12 Referring Provider (PT): Dr. Lorin Mercy   Encounter Date: 05/13/2021   PT End of Session - 05/13/21 1002     Visit Number 9    Number of Visits 13    Date for PT Re-Evaluation 06/17/21    Authorization Type U[employer]    Progress Note Due on Visit 10    PT Start Time 0933    PT Stop Time 1014    PT Time Calculation (min) 41 min    Activity Tolerance Patient tolerated treatment well    Behavior During Therapy [facility] for tasks assessed/performed             Past Medical History:  Diagnosis Date   Anxiety    a lot of stressors. panic attacks in younger years.   ED (erectile dysfunction)    GERD (gastroesophageal reflux disease)    very mild   Hyperlipidemia    Hypothyroidism    Thyroid disease    Tobacco abuse     Past Surgical History:  Procedure Laterality Date   [place]     HERNIA REPAIR  2003   right inguinal   LUMBAR LAMINECTOMY/DECOMPRESSION MICRODISCECTOMY N/A 01/07/2021   Procedure: Lumbar three-lumbar four DECOMPRESSION  and  LEFT MICRODISCECTOMY;  Surgeon: Marybelle Killings, MD;  Location: [facility];  Service: Orthopedics;  Laterality: N/A;   POLYPECTOMY      There were no vitals filed for this visit.   Subjective Assessment - 05/13/21 0952     Subjective Pt arriving today reporting 3/10 pain in his left lower back. Pt reproting he tried the tennis ball soft tissue mobs which helped last night.    Pertinent History anxiety, hyperlipidemia, hypothyrodism    Patient Stated Goals be able to kayak, walk without pain    Currently in Pain? Yes    Pain Score 3     Pain Orientation Left;Lower    Pain Descriptors / Indicators Sore    Pain Type Chronic pain    Pain Onset More than a month ago                                [facility] Adult PT Treatment/Exercise - 05/13/21 0001       Exercises   Exercises Lumbar      Lumbar Exercises: Stretches   Lower Trunk Rotation 3 reps   15 sec x 3 bilateral   Piriformis Stretch 3 reps;30 seconds;Left;Right    Figure 4 Stretch 30 seconds;3 reps   bilateral   Other Lumbar Stretch Exercise QL stretch in doorway x 3 holding 20 seconds      Lumbar Exercises: Aerobic   Recumbent Bike L5 x 8 minutes      Lumbar Exercises: Machines for Strengthening   Leg Press Double leg 2x20,  125 lbs, single leg press: 75 lbs 2x10    Other Lumbar Machine Exercise BATCA rows: 65# 2x20      Lumbar Exercises: Standing   Other Standing Lumbar Exercises functional squats at sink with UE support x 10    Other Standing Lumbar Exercises TRX x 10 squats                      PT  Short Term Goals - 05/05/21 1022       PT SHORT TERM GOAL #1   Title Pt will be independent in his initial HEP.    Status Achieved               PT Long Term Goals - 05/13/21 1012       PT LONG TERM GOAL #1   Title Pt will be independent in his advaced HEP.    Status On-going      PT LONG TERM GOAL #2   Title Pt will improve his 5 time sit to stand to </= 14 seconds with no UE support.    Status Achieved      PT LONG TERM GOAL #3   Title Pt will improve bilateral hip strength to >/= 5/5 to improve functional mobility.    Status On-going      PT LONG TERM GOAL #4   Title Pt will be able to lift 25# using bilateral UE's overhead with no pain reported using correct body mechanics.    Status On-going      PT LONG TERM GOAL #5   Title Pt will be able to perform supine to sit and sit to supine with no pain reported.    Baseline Pt reporting he still has to use his UE's to assist with supine to sit but pain is low if any    Status On-going      PT LONG TERM GOAL #6   Title Pt will improve FOTO to >/= 61%.    Status On-going                    Plan - 05/13/21 1003     Clinical Impression Statement Pt tolerating exercises well. Pain noted in left sided lowe back with lifting his right LE into hip flexion. Progresing with functional squats using TRX, lumbar stretching and strengthening. Continue skilled PT to maximize function.    Personal Factors and Comorbidities Comorbidity 3+    Comorbidities anxiety, hyperlipidemia, hyppothyrodism, thyroid disease, s/p lumbar decompression 01/07/2021    Examination-Activity Limitations Lift;Stand;Squat;Stairs;Bed Mobility;Carry;Transfers;Reach Overhead    Examination-Participation Restrictions Community Activity;Other;Yard Work    Stability/Clinical Decision Making Stable/Uncomplicated    Rehab Potential Good    PT Frequency 2x / week    PT Duration 6 weeks    PT Treatment/Interventions ADLs/Self Care Home Management;Electrical Stimulation;Cryotherapy;Iontophoresis 4mg /ml Dexamethasone;Traction;Moist Heat;Ultrasound;Balance training;Therapeutic exercise;Therapeutic activities;Functional mobility training;Stair training;Gait training;Neuromuscular re-education;Patient/family education;Passive range of motion;Manual techniques;Taping;Dry needling    PT Next Visit Plan Core strengthening, lumbar stretching    PT Home Exercise Plan Access Code: [email or web address]    Consulted and Agree with Plan of Care Patient             Patient will benefit from skilled therapeutic intervention in order to improve the following deficits and impairments:  Pain, Decreased strength, Decreased mobility, Postural dysfunction, Decreased balance, Impaired flexibility, Decreased activity tolerance, Difficulty walking  Visit Diagnosis: Acute bilateral low back pain without sciatica  Pain in left hip  Difficulty in walking, not elsewhere classified  Muscle weakness (generalized)     Problem List Patient Active Problem List   Diagnosis Date Noted   Status post lumbar spine surgery for decompression of  spinal cord 02/17/2021   L 01/09/2021   Foot drop, left 01/05/2021   B12 deficiency 09/10/2019   History of adenomatous polyp of colon 03/24/2018   Former smoker 12/12/2012   ERECTILE DYSFUNCTION 10/03/2008   Hypothyroidism 10/02/2007   Hyperlipidemia  10/02/2007    Oretha Caprice, PT, MPT 05/13/2021, 10:13 AM  [facility] [address] [place], [place], [address] Phone: [phone]   Fax:  [phone]  Name: AKON REINOSO MRN: [id number] Date of Birth: Jul 05, 1961

## 2021-05-19 ENCOUNTER — Other Ambulatory Visit: Payer: Self-pay

## 2021-05-19 ENCOUNTER — Ambulatory Visit (INDEPENDENT_AMBULATORY_CARE_PROVIDER_SITE_OTHER): Payer: 59 | Admitting: Physical Therapy

## 2021-05-19 ENCOUNTER — Encounter: Payer: Self-pay | Admitting: Physical Therapy

## 2021-05-19 DIAGNOSIS — M6281 Muscle weakness (generalized): Secondary | ICD-10-CM | POA: Diagnosis not present

## 2021-05-19 DIAGNOSIS — R262 Difficulty in walking, not elsewhere classified: Secondary | ICD-10-CM

## 2021-05-19 DIAGNOSIS — M25552 Pain in left hip: Secondary | ICD-10-CM | POA: Diagnosis not present

## 2021-05-19 DIAGNOSIS — M545 Low back pain, unspecified: Secondary | ICD-10-CM | POA: Diagnosis not present

## 2021-05-19 NOTE — Therapy (Signed)
[place], [place], [address] Phone: [phone]   Fax:  [phone]  Physical Therapy Treatment Progress Note  Patient Details  Name: Cory Moore MRN: [id number] Date of Birth: 10-Dec-1960 Referring Provider (PT): Dr. Lorin Mercy   Encounter Date: 05/19/2021   PT End of Session - 05/19/21 0939     Visit Number 10    Number of Visits 13    Date for PT Re-Evaluation 06/17/21    Authorization Type UHC    Progress Note Due on Visit 20    PT Start Time 0933    PT Stop Time 1014    PT Time Calculation (min) 41 min    Activity Tolerance Patient tolerated treatment well    Behavior During Therapy [facility] for tasks assessed/performed             Past Medical History:  Diagnosis Date   Anxiety    a lot of stressors. panic attacks in younger years.   ED (erectile dysfunction)    GERD (gastroesophageal reflux disease)    very mild   Hyperlipidemia    Hypothyroidism    Thyroid disease    Tobacco abuse     Past Surgical History:  Procedure Laterality Date   [place]     HERNIA REPAIR  2003   right inguinal   LUMBAR LAMINECTOMY/DECOMPRESSION MICRODISCECTOMY N/A 01/07/2021   Procedure: Lumbar three-lumbar four DECOMPRESSION  and  LEFT MICRODISCECTOMY;  Surgeon: Marybelle Killings, MD;  Location: [facility];  Service: Orthopedics;  Laterality: N/A;   POLYPECTOMY      There were no vitals filed for this visit.   Subjective Assessment - 05/19/21 0938     Subjective Pt arriving today reporting 2/10 pain in his left side low back.    Pertinent History anxiety, hyperlipidemia, hypothyrodism    Patient Stated Goals be able to kayak, walk without pain    Currently in Pain? Yes    Pain Score 2     Pain Location Back    Pain Orientation Left;Lower    Pain Descriptors / Indicators Sore    Pain Type Chronic pain    Pain Onset More than a month ago                [facility] PT Assessment - 05/19/21 0001        Assessment   Medical Diagnosis L3-4, lumbar decompression Z98.890,    Referring Provider (PT) Dr. Lorin Mercy    Onset Date/Surgical Date 01/07/21    Hand Dominance Right      AROM   Overall AROM Comments lumbar in standing, hip in supine    Right Hip Flexion 114    Right Hip External Rotation  65    Right Hip ABduction 50    Left Hip Flexion 112    Left Hip External Rotation  65    Left Hip ABduction 50    Lumbar Flexion 40    Lumbar Extension 20    Lumbar - Right Side Bend 40    Lumbar - Left Side Bend 40    Lumbar - Right Rotation WFL    Lumbar - Left Rotation [facility]      Strength   Right/Left Hip Right;Left    Right Hip Flexion 5/5    Right Hip ABduction 5/5    Right Hip ADduction 5/5    Left Hip Flexion 5/5    Left Hip ABduction 5/5    Left Hip ADduction  5/5      Flexibility   Hamstrings right hamstring: 64 degrees, L hamstring 54 degrees   in supine with opposite knee flexed     Palpation   Palpation comment TTP: left QL                           [facility] Adult PT Treatment/Exercise - 05/19/21 0001       Exercises   Exercises Lumbar      Lumbar Exercises: Stretches   Lower Trunk Rotation 3 reps   15 sec x 3 bilateral   Hip Flexor Stretch Left;3 reps;20 seconds    Piriformis Stretch 3 reps;30 seconds;Left;Right    Figure 4 Stretch 30 seconds;3 reps   bilateral   Other Lumbar Stretch Exercise QL stretch in doorway x 3 holding 20 seconds      Lumbar Exercises: Aerobic   Tread Mill 8 minutes 2.5 mph      Lumbar Exercises: Machines for Strengthening   Leg Press Double leg 3x15,  125 lbs, single leg press: 75 lbs 2x15    Other Lumbar Machine Exercise BATCA rows: 65# 2x20      Lumbar Exercises: Standing   Other Standing Lumbar Exercises TRX 2 x 10 squats                      PT Short Term Goals - 05/05/21 1022       PT SHORT TERM GOAL #1   Title Pt will be independent in his initial HEP.    Status Achieved                PT Long Term Goals - 05/13/21 1012       PT LONG TERM GOAL #1   Title Pt will be independent in his advaced HEP.    Status On-going      PT LONG TERM GOAL #2   Title Pt will improve his 5 time sit to stand to </= 14 seconds with no UE support.    Status Achieved      PT LONG TERM GOAL #3   Title Pt will improve bilateral hip strength to >/= 5/5 to improve functional mobility.    Status On-going      PT LONG TERM GOAL #4   Title Pt will be able to lift 25# using bilateral UE's overhead with no pain reported using correct body mechanics.    Status On-going      PT LONG TERM GOAL #5   Title Pt will be able to perform supine to sit and sit to supine with no pain reported.    Baseline Pt reporting he still has to use his UE's to assist with supine to sit but pain is low if any    Status On-going      PT LONG TERM GOAL #6   Title Pt will improve FOTO to >/= 61%.    Status On-going                   Plan - 05/19/21 0955     Clinical Impression Statement Pt has made progress with strength which is grossly 5/5 in bilateral LE's. Pt has also improved in lumbar and hip ROM and reports less stiffness. Pt is compliant in his HEP. Pt still needs to work on getting up and down off the floor with minimal pain in his left low back. Pt still reporting 2/10 pain today but  stating overall improvements. Continue skilled PT 3 more visits and assess pt's LTG's to maximize funciton.    Personal Factors and Comorbidities Comorbidity 3+    Comorbidities anxiety, hyperlipidemia, hyppothyrodism, thyroid disease, s/p lumbar decompression 01/07/2021    Examination-Activity Limitations Lift;Stand;Squat;Stairs;Bed Mobility;Carry;Transfers;Reach Overhead    Examination-Participation Restrictions Community Activity;Other;Yard Work    Stability/Clinical Decision Making Stable/Uncomplicated    Rehab Potential Good    PT Frequency 2x / week    PT Duration 6 weeks    PT Treatment/Interventions  ADLs/Self Care Home Management;Electrical Stimulation;Cryotherapy;Iontophoresis 4mg /ml Dexamethasone;Traction;Moist Heat;Ultrasound;Balance training;Therapeutic exercise;Therapeutic activities;Functional mobility training;Stair training;Gait training;Neuromuscular re-education;Patient/family education;Passive range of motion;Manual techniques;Taping;Dry needling    PT Home Exercise Plan Access Code: [email or web address]    Consulted and Agree with Plan of Care Patient             Patient will benefit from skilled therapeutic intervention in order to improve the following deficits and impairments:  Pain, Decreased strength, Decreased mobility, Postural dysfunction, Decreased balance, Impaired flexibility, Decreased activity tolerance, Difficulty walking  Visit Diagnosis: Acute bilateral low back pain without sciatica  Difficulty in walking, not elsewhere classified  Pain in left hip  Muscle weakness (generalized)     Problem List Patient Active Problem List   Diagnosis Date Noted   Status post lumbar spine surgery for decompression of spinal cord 02/17/2021   L 01/09/2021   Foot drop, left 01/05/2021   B12 deficiency 09/10/2019   History of adenomatous polyp of colon 03/24/2018   Former smoker 12/12/2012   ERECTILE DYSFUNCTION 10/03/2008   Hypothyroidism 10/02/2007   Hyperlipidemia 10/02/2007    Oretha Caprice PT, MPT 05/19/2021, [facility] [address] [place], [place], [address] Phone: [phone]   Fax:  [phone]  Name: Cory Moore MRN: [id number] Date of Birth: [birth year]

## 2021-05-21 ENCOUNTER — Encounter: Payer: 59 | Admitting: Rehabilitative and Restorative Service Providers"

## 2021-05-25 ENCOUNTER — Ambulatory Visit (INDEPENDENT_AMBULATORY_CARE_PROVIDER_SITE_OTHER): Payer: 59 | Admitting: Rehabilitative and Restorative Service Providers"

## 2021-05-25 ENCOUNTER — Other Ambulatory Visit: Payer: Self-pay

## 2021-05-25 ENCOUNTER — Encounter: Payer: Self-pay | Admitting: Rehabilitative and Restorative Service Providers"

## 2021-05-25 DIAGNOSIS — M6281 Muscle weakness (generalized): Secondary | ICD-10-CM | POA: Diagnosis not present

## 2021-05-25 DIAGNOSIS — R262 Difficulty in walking, not elsewhere classified: Secondary | ICD-10-CM | POA: Diagnosis not present

## 2021-05-25 DIAGNOSIS — M545 Low back pain, unspecified: Secondary | ICD-10-CM | POA: Diagnosis not present

## 2021-05-25 DIAGNOSIS — M25552 Pain in left hip: Secondary | ICD-10-CM | POA: Diagnosis not present

## 2021-05-25 NOTE — Therapy (Signed)
[place], [place], [address] Phone: [phone]   Fax:  [phone]  Physical Therapy Treatment  Patient Details  Name: Cory Moore MRN: [id number] Date of Birth: 1961-11-09 Referring Provider (PT): Dr. Lorin Mercy   Encounter Date: 05/25/2021   PT End of Session - 05/25/21 1611     Visit Number 11    Number of Visits 13    Date for PT Re-Evaluation 06/17/21    Authorization Type UHC    Progress Note Due on Visit 20    PT Start Time 1749    PT Stop Time 1636    PT Time Calculation (min) 39 min    Activity Tolerance Patient tolerated treatment well    Behavior During Therapy [facility] for tasks assessed/performed             Past Medical History:  Diagnosis Date   Anxiety    a lot of stressors. panic attacks in younger years.   ED (erectile dysfunction)    GERD (gastroesophageal reflux disease)    very mild   Hyperlipidemia    Hypothyroidism    Thyroid disease    Tobacco abuse     Past Surgical History:  Procedure Laterality Date   [place]     HERNIA REPAIR  2003   right inguinal   LUMBAR LAMINECTOMY/DECOMPRESSION MICRODISCECTOMY N/A 01/07/2021   Procedure: Lumbar three-lumbar four DECOMPRESSION  and  LEFT MICRODISCECTOMY;  Surgeon: Marybelle Killings, MD;  Location: [facility];  Service: Orthopedics;  Laterality: N/A;   POLYPECTOMY      There were no vitals filed for this visit.   Subjective Assessment - 05/25/21 1610     Subjective Pt. indicated mild ache upon arrival today.  Indicated he didn't get much walking over weekend.    Pertinent History anxiety, hyperlipidemia, hypothyrodism    Patient Stated Goals be able to kayak, walk without pain    Currently in Pain? Yes    Pain Score --   mild ache indicated   Pain Location Back    Pain Orientation Lower    Pain Descriptors / Indicators Aching    Pain Type Chronic pain    Pain Onset More than a month ago    Pain Frequency  Intermittent    Aggravating Factors  general insidious ache    Pain Relieving Factors movement, exercise                [facility] PT Assessment - 05/25/21 0001       Observation/Other Assessments   Focus on Therapeutic Outcomes (FOTO)  update on visit 10 at 78                           [facility] Adult PT Treatment/Exercise - 05/25/21 0001       Lumbar Exercises: Aerobic   Tread Mill 15 mins 2.2 mph      Lumbar Exercises: Machines for Strengthening   Leg Press Double leg 3 x 15 125 lbs, 75 lbs 2 x 15 bilateral    Other Lumbar Machine Exercise BATCA rows: 65# 2x20, lat pull down 2 x 20 45 lbs                      PT Short Term Goals - 05/05/21 1022       PT SHORT TERM GOAL #1   Title Pt will be independent in his initial HEP.  Status Achieved               PT Long Term Goals - 05/13/21 1012       PT LONG TERM GOAL #1   Title Pt will be independent in his advaced HEP.    Status On-going      PT LONG TERM GOAL #2   Title Pt will improve his 5 time sit to stand to </= 14 seconds with no UE support.    Status Achieved      PT LONG TERM GOAL #3   Title Pt will improve bilateral hip strength to >/= 5/5 to improve functional mobility.    Status On-going      PT LONG TERM GOAL #4   Title Pt will be able to lift 25# using bilateral UE's overhead with no pain reported using correct body mechanics.    Status On-going      PT LONG TERM GOAL #5   Title Pt will be able to perform supine to sit and sit to supine with no pain reported.    Baseline Pt reporting he still has to use his UE's to assist with supine to sit but pain is low if any    Status On-going      PT LONG TERM GOAL #6   Title Pt will improve FOTO to >/= 61%.    Status On-going                   Plan - 05/25/21 1633     Clinical Impression Statement Pt. to continue to benefit from plan to improve overall hip/leg and core strength with progressive increase in  activity load with continued skilled PT services indicated to help progress towards PLOF.    Personal Factors and Comorbidities Comorbidity 3+    Comorbidities anxiety, hyperlipidemia, hyppothyrodism, thyroid disease, s/p lumbar decompression 01/07/2021    Examination-Activity Limitations Lift;Stand;Squat;Stairs;Bed Mobility;Carry;Transfers;Reach Overhead    Examination-Participation Restrictions Community Activity;Other;Yard Work    Stability/Clinical Decision Making Stable/Uncomplicated    Rehab Potential Good    PT Frequency 2x / week    PT Duration 6 weeks    PT Treatment/Interventions ADLs/Self Care Home Management;Electrical Stimulation;Cryotherapy;Iontophoresis 4mg /ml Dexamethasone;Traction;Moist Heat;Ultrasound;Balance training;Therapeutic exercise;Therapeutic activities;Functional mobility training;Stair training;Gait training;Neuromuscular re-education;Patient/family education;Passive range of motion;Manual techniques;Taping;Dry needling    PT Next Visit Plan Continue strengthening program, transitioning towards HEP    PT Home Exercise Plan Access Code: [email or web address]    Consulted and Agree with Plan of Care Patient             Patient will benefit from skilled therapeutic intervention in order to improve the following deficits and impairments:  Pain, Decreased strength, Decreased mobility, Postural dysfunction, Decreased balance, Impaired flexibility, Decreased activity tolerance, Difficulty walking  Visit Diagnosis: Acute bilateral low back pain without sciatica  Difficulty in walking, not elsewhere classified  Pain in left hip  Muscle weakness (generalized)     Problem List Patient Active Problem List   Diagnosis Date Noted   Status post lumbar spine surgery for decompression of spinal cord 02/17/2021   L 01/09/2021   Foot drop, left 01/05/2021   B12 deficiency 09/10/2019   History of adenomatous polyp of colon 03/24/2018   Former smoker 12/12/2012   ERECTILE  DYSFUNCTION 10/03/2008   Hypothyroidism 10/02/2007   Hyperlipidemia 10/02/2007    Scot Jun, PT, DPT, OCS, ATC 05/25/21  4:36 PM    [facility] [address] [place], [place], [address] Phone: [phone]   Fax:  [phone]  Name: Cory Moore MRN: [id number] Date of Birth: 09-Aug-1961

## 2021-06-03 ENCOUNTER — Ambulatory Visit (INDEPENDENT_AMBULATORY_CARE_PROVIDER_SITE_OTHER): Payer: 59 | Admitting: Physical Therapy

## 2021-06-03 ENCOUNTER — Encounter: Payer: Self-pay | Admitting: Physical Therapy

## 2021-06-03 ENCOUNTER — Other Ambulatory Visit: Payer: Self-pay

## 2021-06-03 DIAGNOSIS — M25552 Pain in left hip: Secondary | ICD-10-CM

## 2021-06-03 DIAGNOSIS — R262 Difficulty in walking, not elsewhere classified: Secondary | ICD-10-CM | POA: Diagnosis not present

## 2021-06-03 DIAGNOSIS — M6281 Muscle weakness (generalized): Secondary | ICD-10-CM | POA: Diagnosis not present

## 2021-06-03 DIAGNOSIS — M545 Low back pain, unspecified: Secondary | ICD-10-CM | POA: Diagnosis not present

## 2021-06-03 NOTE — Therapy (Signed)
[place], [place], [address] Phone: [phone]   Fax:  [phone]  Physical Therapy Treatment  Patient Details  Name: Cory Moore MRN: [id number] Date of Birth: [birth year] Referring Provider (PT): Dr. Lorin Mercy   Encounter Date: 06/03/2021   PT End of Session - 06/03/21 0932     Visit Number 12    Number of Visits 13    Date for PT Re-Evaluation 06/17/21    Authorization Type UHC    Progress Note Due on Visit 20    PT Start Time 0927    PT Stop Time 1005    PT Time Calculation (min) 38 min    Activity Tolerance Patient tolerated treatment well    Behavior During Therapy [facility] for tasks assessed/performed             Past Medical History:  Diagnosis Date   Anxiety    a lot of stressors. panic attacks in younger years.   ED (erectile dysfunction)    GERD (gastroesophageal reflux disease)    very mild   Hyperlipidemia    Hypothyroidism    Thyroid disease    Tobacco abuse     Past Surgical History:  Procedure Laterality Date   [place]     HERNIA REPAIR  2003   right inguinal   LUMBAR LAMINECTOMY/DECOMPRESSION MICRODISCECTOMY N/A 01/07/2021   Procedure: Lumbar three-lumbar four DECOMPRESSION  and  LEFT MICRODISCECTOMY;  Surgeon: Marybelle Killings, MD;  Location: [place];  Service: Orthopedics;  Laterality: N/A;   POLYPECTOMY      There were no vitals filed for this visit.   Subjective Assessment - 06/03/21 0930     Subjective Pt arriving today after camping over the weekend and reported he had to do a lot o bending and hiking and at the end of the day pt reported pain was 4/10. Currently pain is 1/10.    Pertinent History anxiety, hyperlipidemia, hypothyrodism    How long can you sit comfortably? 30 minutes    Diagnostic tests X-ray    Patient Stated Goals be able to kayak, walk without pain    Currently in Pain? Yes    Pain Location Back    Pain Orientation Lower    Pain  Descriptors / Indicators Sore    Pain Type Chronic pain    Pain Onset More than a month ago                [facility] PT Assessment - 06/03/21 0001       Assessment   Medical Diagnosis L3-4 lumbar decompression    Referring Provider (PT) Dr. Lorin Mercy    Onset Date/Surgical Date 01/07/21      Strength   Right/Left Hip Right;Left    Right Hip Flexion 5/5    Right Hip ABduction 5/5    Right Hip ADduction 5/5    Left Hip Flexion 5/5    Left Hip ABduction 5/5    Left Hip ADduction 5/5                           [facility] Adult PT Treatment/Exercise - 06/03/21 0001       Exercises   Exercises Lumbar      Lumbar Exercises: Aerobic   Tread Mill 15 minutes 2.3 mph      Lumbar Exercises: Machines for Strengthening   Leg Press Double leg 2x15 125 lbs, 75 lbs 2 x  15 bilateral    Other Lumbar Machine Exercise BATCA rows: 65# 3x10, lat pull down 3 x 10 45 lbs      Lumbar Exercises: Standing   Other Standing Lumbar Exercises lifting 15#, 20# and 25# kettle bell from counter height to over head shelf x 5 each                      PT Short Term Goals - 05/05/21 1022       PT SHORT TERM GOAL #1   Title Pt will be independent in his initial HEP.    Status Achieved               PT Long Term Goals - 06/03/21 0949       PT LONG TERM GOAL #1   Title Pt will be independent in his advaced HEP.    Status On-going      PT LONG TERM GOAL #2   Title Pt will improve his 5 time sit to stand to </= 14 seconds with no UE support.    Status Achieved      PT LONG TERM GOAL #3   Title Pt will improve bilateral hip strength to >/= 5/5 to improve functional mobility.    Status Achieved      PT LONG TERM GOAL #4   Title Pt will be able to lift 25# using bilateral UE's overhead with no pain reported using correct body mechanics.    Baseline Pt able to lift 25# from counter height to over head shelf without difficulty using correct body mechanics.    Status  Achieved      PT LONG TERM GOAL #5   Title Pt will be able to perform supine to sit and sit to supine with no pain reported.    Baseline Pt able to perform log roll supine to sit without pain, minimal pain when rolling in bed and then trying to get up without log rolling    Status Achieved      PT LONG TERM GOAL #6   Title Pt will improve FOTO to >/= 61%.    Status On-going                   Plan - 06/03/21 0945     Clinical Impression Statement Pt is continuing to make progress with overall hip/leg and core strength. Skilled PT needed one additional visit to update pt's HEP and for discharge.    Comorbidities anxiety, hyperlipidemia, hyppothyrodism, thyroid disease, s/p lumbar decompression 01/07/2021    Examination-Activity Limitations Lift;Stand;Squat;Stairs;Bed Mobility;Carry;Transfers;Reach Overhead    Examination-Participation Restrictions Community Activity;Other;Yard Work    Stability/Clinical Decision Making Stable/Uncomplicated    Rehab Potential Good    PT Frequency 2x / week    PT Duration 6 weeks    PT Treatment/Interventions ADLs/Self Care Home Management;Electrical Stimulation;Cryotherapy;Iontophoresis 4mg /ml Dexamethasone;Traction;Moist Heat;Ultrasound;Balance training;Therapeutic exercise;Therapeutic activities;Functional mobility training;Stair training;Gait training;Neuromuscular re-education;Patient/family education;Passive range of motion;Manual techniques;Taping;Dry needling    PT Next Visit Plan Discharge to home program, Update HEP, FOTO    PT Home Exercise Plan Access Code: [email or web address]    Consulted and Agree with Plan of Care Patient             Patient will benefit from skilled therapeutic intervention in order to improve the following deficits and impairments:  Pain, Decreased strength, Decreased mobility, Postural dysfunction, Decreased balance, Impaired flexibility, Decreased activity tolerance, Difficulty walking  Visit Diagnosis: Acute  bilateral low back pain without sciatica  Difficulty in walking, not elsewhere classified  Pain in left hip  Muscle weakness (generalized)     Problem List Patient Active Problem List   Diagnosis Date Noted   Status post lumbar spine surgery for decompression of spinal cord 02/17/2021   L 01/09/2021   Foot drop, left 01/05/2021   B12 deficiency 09/10/2019   History of adenomatous polyp of colon 03/24/2018   Former smoker 12/12/2012   ERECTILE DYSFUNCTION 10/03/2008   Hypothyroidism 10/02/2007   Hyperlipidemia 10/02/2007    Oretha Caprice, PT, MPT 06/03/2021, 10:02 AM  [facility] Physical Therapy [address] [place], [place], [address] Phone: [phone]   Fax:  [phone]  Name: Cory Moore MRN: [id number] Date of Birth: [birth year]

## 2021-06-15 ENCOUNTER — Other Ambulatory Visit: Payer: Self-pay

## 2021-06-15 ENCOUNTER — Encounter: Payer: Self-pay | Admitting: Physical Therapy

## 2021-06-15 ENCOUNTER — Ambulatory Visit (INDEPENDENT_AMBULATORY_CARE_PROVIDER_SITE_OTHER): Payer: 59 | Admitting: Physical Therapy

## 2021-06-15 DIAGNOSIS — M25552 Pain in left hip: Secondary | ICD-10-CM

## 2021-06-15 DIAGNOSIS — M6281 Muscle weakness (generalized): Secondary | ICD-10-CM | POA: Diagnosis not present

## 2021-06-15 DIAGNOSIS — M545 Low back pain, unspecified: Secondary | ICD-10-CM | POA: Diagnosis not present

## 2021-06-15 DIAGNOSIS — R262 Difficulty in walking, not elsewhere classified: Secondary | ICD-10-CM | POA: Diagnosis not present

## 2021-06-15 NOTE — Therapy (Signed)
[place], [place], [address] Phone: [phone]   Fax:  [phone]  Physical Therapy Treatment Discharge  Patient Details  Name: Cory Moore MRN: [id number] Date of Birth: [birth year] Referring Provider (PT): Dr. Lorin Mercy   Encounter Date: 06/15/2021   PT End of Session - 06/15/21 1310     Visit Number 13    Number of Visits 13    Date for PT Re-Evaluation 06/17/21    Authorization Type UHC    Progress Note Due on Visit 20    PT Start Time 1302    PT Stop Time 1345    PT Time Calculation (min) 43 min    Activity Tolerance Patient tolerated treatment well    Behavior During Therapy [facility] for tasks assessed/performed             Past Medical History:  Diagnosis Date   Anxiety    a lot of stressors. panic attacks in younger years.   ED (erectile dysfunction)    GERD (gastroesophageal reflux disease)    very mild   Hyperlipidemia    Hypothyroidism    Thyroid disease    Tobacco abuse     Past Surgical History:  Procedure Laterality Date   [place]     HERNIA REPAIR  2003   right inguinal   LUMBAR LAMINECTOMY/DECOMPRESSION MICRODISCECTOMY N/A 01/07/2021   Procedure: Lumbar three-lumbar four DECOMPRESSION  and  LEFT MICRODISCECTOMY;  Surgeon: Marybelle Killings, MD;  Location: [place];  Service: Orthopedics;  Laterality: N/A;   POLYPECTOMY      There were no vitals filed for this visit.   Subjective Assessment - 06/15/21 1308     Subjective Pt arriving reporting mild sorness after camping and hiking this weekend. Pt reporting 1/10 pain in his low back. Pt did state that bending and lifting is much better.    Pertinent History anxiety, hyperlipidemia, hypothyrodism    How long can you sit comfortably? 30 minutes    Diagnostic tests X-ray    Patient Stated Goals be able to kayak, walk without pain    Currently in Pain? Yes    Pain Score 1     Pain Location Back    Pain Orientation  Lower    Pain Descriptors / Indicators Sore    Pain Type Chronic pain    Pain Onset More than a month ago                [facility] PT Assessment - 06/15/21 0001       Assessment   Medical Diagnosis L3-4 lumbar decompression    Referring Provider (PT) Dr. Lorin Mercy    Onset Date/Surgical Date 01/07/21      Observation/Other Assessments   Focus on Therapeutic Outcomes (FOTO)  84% (up from 44% at evaluation)      AROM   Lumbar Flexion 50    Lumbar Extension 25    Lumbar - Right Side Bend 40    Lumbar - Left Side Bend 40      Strength   Right Hip Flexion 5/5    Right Hip Extension 5/5    Right Hip ABduction 5/5    Right Hip ADduction 5/5    Left Hip Flexion 5/5    Left Hip ABduction 5/5    Left Hip ADduction 5/5  [facility] Adult PT Treatment/Exercise - 06/15/21 0001       Neuro Re-ed    Neuro Re-ed Details  BOSU ball x 3 minutes with intermittent UE support, walking on airex beam x 10 with occassional finger taps      Exercises   Exercises Lumbar      Lumbar Exercises: Aerobic   Tread Mill 10 minutes 2.4 mph      Lumbar Exercises: Machines for Strengthening   Leg Press Double leg 2x15 125 lbs, 75 lbs 2 x 15 bilateral      Lumbar Exercises: Standing   Other Standing Lumbar Exercises lifting 28# box floor and carry across gym floor x 40 feet                      PT Short Term Goals - 06/15/21 1312       PT SHORT TERM GOAL #1   Title Pt will be independent in his initial HEP.    Status Achieved               PT Long Term Goals - 06/15/21 1312       PT LONG TERM GOAL #1   Title Pt will be independent in his advaced HEP.    Status Achieved      PT LONG TERM GOAL #2   Title Pt will improve his 5 time sit to stand to </= 14 seconds with no UE support.    Status Achieved      PT LONG TERM GOAL #3   Title Pt will improve bilateral hip strength to >/= 5/5 to improve functional mobility.    Baseline 5/5  grossly bilateral LE's    Status Achieved      PT LONG TERM GOAL #4   Title Pt will be able to lift 25# using bilateral UE's overhead with no pain reported using correct body mechanics.    Status Achieved      PT LONG TERM GOAL #5   Title Pt will be able to perform supine to sit and sit to supine with no pain reported.    Status Achieved      PT LONG TERM GOAL #6   Title Pt will improve FOTO to >/= 61%.    Baseline 84% on 06/15/2021    Status Achieved                   Plan - 06/15/21 1327     Clinical Impression Statement Pt has currently met all his initial STG's and LTG's set at his evaluation. Pt's HEP was updated with added stretches. Pt was instructed in transitions on/off floor and low surface. Pt able to lift 28# box from floor and carry across the room using correct body mechanics.Pt is doing well with more dynamic balance activities. Pt to continue a walking program.  Pt is being discharged from outpatient Physical Therapy.    Personal Factors and Comorbidities Comorbidity 3+    Comorbidities anxiety, hyperlipidemia, hyppothyrodism, thyroid disease, s/p lumbar decompression 01/07/2021    Examination-Activity Limitations Lift;Stand;Squat;Stairs;Bed Mobility;Carry;Transfers;Reach Overhead    Examination-Participation Restrictions Community Activity;Other;Yard Work    Stability/Clinical Decision Making Stable/Uncomplicated    Rehab Potential Good    PT Frequency 2x / week    PT Duration 6 weeks    PT Treatment/Interventions ADLs/Self Care Home Management;Electrical Stimulation;Cryotherapy;Iontophoresis 4mg /ml Dexamethasone;Traction;Moist Heat;Ultrasound;Balance training;Therapeutic exercise;Therapeutic activities;Functional mobility training;Stair training;Gait training;Neuromuscular re-education;Patient/family education;Passive range of motion;Manual techniques;Taping;Dry needling    PT Next Visit Plan  Discharge    PT Home Exercise Plan Access Code: [email or web address]    Consulted  and Agree with Plan of Care Patient             Patient will benefit from skilled therapeutic intervention in order to improve the following deficits and impairments:  Pain, Decreased strength, Decreased mobility, Postural dysfunction, Decreased balance, Impaired flexibility, Decreased activity tolerance, Difficulty walking  Visit Diagnosis: Acute bilateral low back pain without sciatica  Difficulty in walking, not elsewhere classified  Pain in left hip  Muscle weakness (generalized)     Problem List Patient Active Problem List   Diagnosis Date Noted   Status post lumbar spine surgery for decompression of spinal cord 02/17/2021   L 01/09/2021   Foot drop, left 01/05/2021   B12 deficiency 09/10/2019   History of adenomatous polyp of colon 03/24/2018   Former smoker 12/12/2012   ERECTILE DYSFUNCTION 10/03/2008   Hypothyroidism 10/02/2007   Hyperlipidemia 10/02/2007  PHYSICAL THERAPY DISCHARGE SUMMARY  Visits from Start of Care: 13  Current functional level related to goals / functional outcomes See above   Remaining deficits: See above   Education / Equipment: HEP   Patient agrees to discharge. Patient goals were met. Patient is being discharged due to meeting the stated rehab goals.   Oretha Caprice, PT, MPT 06/15/2021, 2:09 PM  [facility] Physical Therapy [address] [place], [place], [address] Phone: [phone]   Fax:  [phone]  Name: Cory Moore MRN: [id number] Date of Birth: 28-Mar-1961

## 2021-06-25 ENCOUNTER — Telehealth: Payer: Self-pay | Admitting: Orthopaedic Surgery

## 2021-06-25 NOTE — Telephone Encounter (Signed)
Patient called asked if it would be ok t for him to have a colonoscopy? Patient said he had  back surgery 01/07/2021.  Patient said he wanted to ask Dr. Lorin Mercy before he is scheduled for the procedure. The number to contact patient is [phone]

## 2021-06-26 NOTE — Telephone Encounter (Signed)
Attempted to contact patient however had to leave a voicemail. Informed patient that he was able to have procedure preformed however if he has any additional questions or concerns he is able to contact the office back.

## 2021-07-01 ENCOUNTER — Encounter: Payer: Self-pay | Admitting: Internal Medicine

## 2021-07-24 ENCOUNTER — Ambulatory Visit (AMBULATORY_SURGERY_CENTER): Payer: 59

## 2021-07-24 ENCOUNTER — Other Ambulatory Visit: Payer: Self-pay

## 2021-07-24 VITALS — Ht 76.0 in | Wt 260.0 lb

## 2021-07-24 DIAGNOSIS — Z86010 Personal history of colonic polyps: Secondary | ICD-10-CM

## 2021-07-24 MED ORDER — PLENVU 140 G PO SOLR: 1.0000 | ORAL | 0 refills | Status: DC

## 2021-07-24 NOTE — Progress Notes (Signed)
Pre visit completed via phone call; Patient verified name, DOB, and address; No egg or soy allergy known to patient  No issues with past sedation with any surgeries or procedures Patient denies ever being told they had issues or difficulty with intubation  No FH of Malignant Hyperthermia No diet pills per patient No home 02 use per patient  No blood thinners per patient  Pt denies issues with constipation  No A fib or A flutter  EMMI video via MyChart  COVID 19 guidelines implemented in PV today with Pt and RN   Pt is fully vaccinated for Covid x 2;  Coupon given to pt in PV today, Code to Pharmacy and NO PA's for preps discussed with pt in PV today  Discussed with pt there will be an out-of-pocket cost for prep and that varies from $0 to 70 +  dollars   Due to the COVID-19 pandemic we are asking patients to follow certain guidelines.  Pt aware of COVID protocols and [facility] guidelines

## 2021-07-29 ENCOUNTER — Encounter: Payer: Self-pay | Admitting: Internal Medicine

## 2021-08-07 ENCOUNTER — Encounter: Payer: Self-pay | Admitting: Internal Medicine

## 2021-08-07 ENCOUNTER — Other Ambulatory Visit: Payer: Self-pay

## 2021-08-07 ENCOUNTER — Ambulatory Visit (AMBULATORY_SURGERY_CENTER): Payer: 59 | Admitting: Internal Medicine

## 2021-08-07 VITALS — BP 115/55 | HR 54 | Temp 97.3°F | Resp 11 | Ht 76.0 in | Wt 260.0 lb

## 2021-08-07 DIAGNOSIS — D123 Benign neoplasm of transverse colon: Secondary | ICD-10-CM

## 2021-08-07 DIAGNOSIS — D125 Benign neoplasm of sigmoid colon: Secondary | ICD-10-CM | POA: Diagnosis not present

## 2021-08-07 DIAGNOSIS — Z86010 Personal history of colonic polyps: Secondary | ICD-10-CM | POA: Diagnosis not present

## 2021-08-07 DIAGNOSIS — D122 Benign neoplasm of ascending colon: Secondary | ICD-10-CM

## 2021-08-07 DIAGNOSIS — D120 Benign neoplasm of cecum: Secondary | ICD-10-CM | POA: Diagnosis not present

## 2021-08-07 DIAGNOSIS — D128 Benign neoplasm of rectum: Secondary | ICD-10-CM | POA: Diagnosis not present

## 2021-08-07 MED ORDER — SODIUM CHLORIDE 0.9 % IV SOLN: 500.0000 mL | Freq: Once | INTRAVENOUS | Status: DC

## 2021-08-07 NOTE — Progress Notes (Signed)
GASTROENTEROLOGY PROCEDURE H&P NOTE   Primary Care Physician: Marin Olp, MD    Reason for Procedure:  Surveillance of adenomatous and sessile serrated colon polyps  Plan:    Colonoscopy  Patient is appropriate for endoscopic procedure(s) in the ambulatory ([place]) setting.  The nature of the procedure, as well as the risks, benefits, and alternatives were carefully and thoroughly reviewed with the patient. Ample time for discussion and questions allowed. The patient understood, was satisfied, and agreed to proceed.     HPI: Cory Moore is a 60 y.o. male who presents for surveillance colonoscopy.  Medical history as below.  Tolerated the prep.  No recent chest pain, shortness of breath or abdominal pain.  Past Medical History:  Diagnosis Date   Anxiety    a lot of stressors. panic attacks in younger years.   ED (erectile dysfunction)    GERD (gastroesophageal reflux disease)    very mild- with certain foods   Hyperlipidemia    on meds   Hypothyroidism    on meds   Thyroid disease    Tobacco abuse     Past Surgical History:  Procedure Laterality Date   APPENDECTOMY  1998   COLONOSCOPY  2019   JMP-MAC-suprep(good)-polyps   LUMBAR LAMINECTOMY/DECOMPRESSION MICRODISCECTOMY N/A 01/07/2021   Procedure: Lumbar three-lumbar four DECOMPRESSION  and  LEFT MICRODISCECTOMY;  Surgeon: Marybelle Killings, MD;  Location: [place];  Service: Orthopedics;  Laterality: N/A;   POLYPECTOMY     UMBILICAL HERNIA REPAIR     as infant and again as an adult   [place]      Prior to Admission medications   Medication Sig Start Date End Date Taking? Authorizing Provider  levothyroxine (SYNTHROID) 125 MCG tablet Take 1 tablet (125 mcg total) by mouth daily. 09/30/20  Yes Marin Olp, MD  simvastatin (ZOCOR) 40 MG tablet TAKE 1 TABLET BY MOUTH EVERY DAY IN THE EVENING Patient taking differently: Take 40 mg by mouth every evening. 09/30/20  Yes  Marin Olp, MD  Cyanocobalamin (VITAMIN B-12 PO) Take 1 tablet by mouth every other day.    [provider]  sildenafil (VIAGRA) 100 MG tablet TAKE 1/2 TO 1 TABLET BY MOUTH DAILY AS NEEDED FOR ERECTILE DYSFUNCTION Patient taking differently: Take 50-100 mg by mouth as needed for erectile dysfunction. 12/11/20   Marin Olp, MD    Current Outpatient Medications  Medication Sig Dispense Refill   levothyroxine (SYNTHROID) 125 MCG tablet Take 1 tablet (125 mcg total) by mouth daily. 90 tablet 3   simvastatin (ZOCOR) 40 MG tablet TAKE 1 TABLET BY MOUTH EVERY DAY IN THE EVENING (Patient taking differently: Take 40 mg by mouth every evening.) 90 tablet 3   Cyanocobalamin (VITAMIN B-12 PO) Take 1 tablet by mouth every other day.     sildenafil (VIAGRA) 100 MG tablet TAKE 1/2 TO 1 TABLET BY MOUTH DAILY AS NEEDED FOR ERECTILE DYSFUNCTION (Patient taking differently: Take 50-100 mg by mouth as needed for erectile dysfunction.) 6 tablet 29   Current Facility-Administered Medications  Medication Dose Route Frequency Provider Last Rate Last Admin   0.9 %  sodium chloride infusion  500 mL Intravenous Once Larrie Lucia, Lajuan Lines, MD        Allergies as of 08/07/2021   (No Known Allergies)    Family History  Problem Relation Age of Onset   Hypertension Mother    Hyperlipidemia Mother    Colon polyps Mother  Hypertension Father    Hyperlipidemia Father    Hyperlipidemia Sister    Hyperlipidemia Brother    Colon cancer Neg Hx    Rectal cancer Neg Hx    Stomach cancer Neg Hx    Esophageal cancer Neg Hx     Social History   Socioeconomic History   Marital status: Significant Other    Spouse name: Not on file   Number of children: Not on file   Years of education: Not on file   Highest education level: Not on file  Occupational History   Not on file  Tobacco Use   Smoking status: Former    Packs/day: 1.00    Years: 25.00    Pack years: 25.00    Types: E-cigarettes,  Cigarettes    Quit date: 11/15/2002    Years since quitting: 18.7   Smokeless tobacco: Never   Tobacco comments:    but still on e cigs daily  Vaping Use   Vaping Use: Every day   Start date: 11/16/2015   Substances: Nicotine  Substance and Sexual Activity   Alcohol use: Yes    Alcohol/week: 14.0 standard drinks    Types: 14 Cans of beer per week   Drug use: No   Sexual activity: Not on file  Other Topics Concern   Not on file  Social History Narrative   Divorced- lives with girlfriend. Daughter data scientist in her 24s, son Rosana Hoes doing better recently- has had substance issues in past- 2020.       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, forensic      Hobbies: outdoors, hiking, camping (Interior and spatial designer one of his favorites locally- annual tripip for 35 years)   Social Determinants of Radio broadcast assistant Strain: Not on file  Food Insecurity: Not on file  Transportation Needs: Not on file  Physical Activity: Not on file  Stress: Not on file  Social Connections: Not on file  Intimate Partner Violence: Not on file    Physical Exam: Vital signs in last 24 hours: _0  117/75   Pulse (!) 54   Temp (!) 97.3 F (36.3 C) (Temporal)   Resp 16   Ht _1  (1.93 m)   Wt 260 lb (117.9 kg)   SpO2 98%   BMI 31.65 kg/m  GEN: NAD EYE: Sclerae anicteric ENT: MMM CV: Non-tachycardic Pulm: CTA b/l GI: Soft, NT/ND NEURO:  Alert & Oriented x 3   Zenovia Jarred, MD [facility]  08/07/2021 8:50 AM

## 2021-08-07 NOTE — Op Note (Signed)
[facility] Patient Name: Alfonza Toft Procedure Date: 08/07/2021 8:42 AM MRN: [id number] Endoscopist: Jerene Bears , MD Age: 60 Referring MD:  Date of Birth: Mar 06, 1961 Gender: Male Account #: [id number] Procedure:                Colonoscopy Indications:              High risk colon cancer surveillance: Personal                            history of multiple adenomas and SSP, Last                            colonoscopy: May 2019 with 6 polyps removed Medicines:                Monitored Anesthesia Care Procedure:                Pre-Anesthesia Assessment:                           - Prior to the procedure, a History and Physical                            was performed, and patient medications and                            allergies were reviewed. The patient's tolerance of                            previous anesthesia was also reviewed. The risks                            and benefits of the procedure and the sedation                            options and risks were discussed with the patient.                            All questions were answered, and informed consent                            was obtained. Prior Anticoagulants: The patient has                            taken no previous anticoagulant or antiplatelet                            agents. ASA Grade Assessment: II - A patient with                            mild systemic disease. After reviewing the risks                            and benefits, the patient was deemed in  satisfactory condition to undergo the procedure.                           After obtaining informed consent, the colonoscope                            was passed under direct vision. Throughout the                            procedure, the patient's blood pressure, pulse, and                            oxygen saturations were monitored continuously. The                            Olympus CF-HQ190L [id number])  Colonoscope was                            introduced through the anus and advanced to the                            cecum, identified by appendiceal orifice and                            ileocecal valve. The colonoscopy was performed                            without difficulty. The patient tolerated the                            procedure well. The quality of the bowel                            preparation was good. The ileocecal valve,                            appendiceal orifice, and rectum were photographed. Scope In: 8:56:21 AM Scope Out: 9:15:30 AM Scope Withdrawal Time: 0 hours 17 minutes 44 seconds  Total Procedure Duration: 0 hours 19 minutes 9 seconds  Findings:                 The digital rectal exam was normal.                           A 4 mm polyp was found in the cecum. The polyp was                            sessile. The polyp was removed with a cold snare.                            Resection and retrieval were complete.                           Four sessile polyps were found in the ascending  colon. The polyps were 2 to 6 mm in size. These                            polyps were removed with a cold snare. Resection                            and retrieval were complete.                           A 3 mm polyp was found in the hepatic flexure. The                            polyp was sessile. The polyp was removed with a                            cold snare. Resection and retrieval were complete.                            To prevent bleeding after the polypectomy, two                            hemostatic clips were successfully placed (MR                            conditional). There was no bleeding at the end of                            the maneuver.                           A 5 mm polyp was found in the sigmoid colon. The                            polyp was sessile. The polyp was removed with a                            cold  snare. Resection and retrieval were complete.                           A 3 mm polyp was found in the proximal rectum. The                            polyp was sessile. The polyp was removed with a                            cold snare. Resection and retrieval were complete.                           Multiple small and large-mouthed diverticula were                            found from transverse colon to sigmoid colon.  Internal hemorrhoids were found during                            retroflexion. The hemorrhoids were medium-sized. Complications:            No immediate complications. Estimated Blood Loss:     Estimated blood loss was minimal. Impression:               - One 4 mm polyp in the cecum, removed with a cold                            snare. Resected and retrieved.                           - Four 2 to 6 mm polyps in the ascending colon,                            removed with a cold snare. Resected and retrieved.                           - One 3 mm polyp at the hepatic flexure, removed                            with a cold snare. Resected and retrieved. Clips                            (MR conditional) were placed.                           - One 5 mm polyp in the sigmoid colon, removed with                            a cold snare. Resected and retrieved.                           - One 3 mm polyp in the proximal rectum, removed                            with a cold snare. Resected and retrieved.                           - Diverticulosis from transverse colon to sigmoid                            colon.                           - Internal hemorrhoids. Recommendation:           - Patient has a contact number available for                            emergencies. The signs and symptoms of potential  delayed complications were discussed with the                            patient. Return to normal activities tomorrow.                             Written discharge instructions were provided to the                            patient.                           - Resume previous diet.                           - Continue present medications.                           - Await pathology results.                           - Repeat colonoscopy is recommended for                            surveillance. The colonoscopy date will be                            determined after pathology results from today's                            exam become available for review. Jerene Bears, MD 08/07/2021 9:20:10 AM This report has been signed electronically.

## 2021-08-07 NOTE — Progress Notes (Signed)
Pt's states no medical or surgical changes since previsit or office visit. 

## 2021-08-07 NOTE — Patient Instructions (Signed)
Discharge instructions given. Handouts on polyps,diverticulosis and hemorrhoids. Resume previous medications. YOU HAD AN ENDOSCOPIC PROCEDURE TODAY AT [facility]:   Refer to the procedure report that was given to you for any specific questions about what was found during the examination.  If the procedure report does not answer your questions, please call your gastroenterologist to clarify.  If you requested that your care partner not be given the details of your procedure findings, then the procedure report has been included in a sealed envelope for you to review at your convenience later.  YOU SHOULD EXPECT: Some feelings of bloating in the abdomen. Passage of more gas than usual.  Walking can help get rid of the air that was put into your GI tract during the procedure and reduce the bloating. If you had a lower endoscopy (such as a colonoscopy or flexible sigmoidoscopy) you may notice spotting of blood in your stool or on the toilet paper. If you underwent a bowel prep for your procedure, you may not have a normal bowel movement for a few days.  Please Note:  You might notice some irritation and congestion in your nose or some drainage.  This is from the oxygen used during your procedure.  There is no need for concern and it should clear up in a day or so.  SYMPTOMS TO REPORT IMMEDIATELY:  Following lower endoscopy (colonoscopy or flexible sigmoidoscopy):  Excessive amounts of blood in the stool  Significant tenderness or worsening of abdominal pains  Swelling of the abdomen that is new, acute  Fever of 100F or higher   For urgent or emergent issues, a gastroenterologist can be reached at any hour by calling [phone]. Do not use MyChart messaging for urgent concerns.    DIET:  We do recommend a small meal at first, but then you may proceed to your regular diet.  Drink plenty of fluids but you should avoid alcoholic beverages for 24 hours.  ACTIVITY:  You should  plan to take it easy for the rest of today and you should NOT DRIVE or use heavy machinery until tomorrow (because of the sedation medicines used during the test).    FOLLOW UP: Our staff will call the number listed on your records 48-72 hours following your procedure to check on you and address any questions or concerns that you may have regarding the information given to you following your procedure. If we do not reach you, we will leave a message.  We will attempt to reach you two times.  During this call, we will ask if you have developed any symptoms of COVID 19. If you develop any symptoms (ie: fever, flu-like symptoms, shortness of breath, cough etc.) before then, please call [phone].  If you test positive for Covid 19 in the 2 weeks post procedure, please call and report this information to [place].    If any biopsies were taken you will be contacted by phone or by letter within the next 1-3 weeks.  Please call us at [phone] if you have not heard about the biopsies in 3 weeks.    SIGNATURES/CONFIDENTIALITY: You and/or your care partner have signed paperwork which will be entered into your electronic medical record.  These signatures attest to the fact that that the information above on your After Visit Summary has been reviewed and is understood.  Full responsibility of the confidentiality of this discharge information lies with you and/or your care-partner.

## 2021-08-07 NOTE — Progress Notes (Signed)
Called to room to assist during endoscopic procedure.  Patient ID and intended procedure confirmed with present staff. Received instructions for my participation in the procedure from the performing physician.  

## 2021-08-07 NOTE — Progress Notes (Signed)
Report to PACU, RN, vss, BBS= Clear.  

## 2021-08-11 ENCOUNTER — Telehealth: Payer: Self-pay

## 2021-08-11 NOTE — Telephone Encounter (Signed)
  Follow up Call-  Call back number 08/07/2021  Post procedure Call Back phone  # [phone]  Permission to leave phone message Yes  Some recent data might be hidden     Patient questions:  Do you have a fever, pain , or abdominal swelling? No. Pain Score  0 *  Have you tolerated food without any problems? Yes.    Have you been able to return to your normal activities? Yes.    Do you have any questions about your discharge instructions: Diet   No. Medications  No. Follow up visit  No.  Do you have questions or concerns about your Care? No.  Actions: * If pain score is 4 or above: No action needed, pain <4.  Have you developed a fever since your procedure? no  2.   Have you had an respiratory symptoms (SOB or cough) since your procedure? no  3.   Have you tested pnositive for COVID 19 since your procedure no  4.   Have you had any family members/close contacts diagnosed with the COVID 19 since your procedure?  no   If yes to any of these questions please route to Joylene John, RN and Joella Prince, RN

## 2021-08-13 ENCOUNTER — Encounter: Payer: Self-pay | Admitting: Internal Medicine

## 2021-09-24 ENCOUNTER — Other Ambulatory Visit: Payer: Self-pay | Admitting: Family Medicine

## 2021-09-24 DIAGNOSIS — E785 Hyperlipidemia, unspecified: Secondary | ICD-10-CM

## 2021-09-29 NOTE — Progress Notes (Signed)
Phone: [phone]   Subjective:  Patient presents today for their annual physical. Chief complaint-noted.   See problem oriented charting- ROS- full  review of systems was completed and negative  except for: runny nose, dizziness, stable numbness on base of feet, anxiety/stress  The following were reviewed and entered/updated in epic: Past Medical History:  Diagnosis Date   Anxiety    a lot of stressors. panic attacks in younger years.   ED (erectile dysfunction)    GERD (gastroesophageal reflux disease)    very mild- with certain foods   Hyperlipidemia    on meds   Hypothyroidism    on meds   Thyroid disease    Tobacco abuse    Patient Active Problem List   Diagnosis Date Noted   History of adenomatous polyp of colon 03/24/2018    Priority: Medium    Hypothyroidism 10/02/2007    Priority: Medium    Hyperlipidemia 10/02/2007    Priority: Medium    Former smoker 12/12/2012    Priority: Low   ERECTILE DYSFUNCTION 10/03/2008    Priority: Low   Status post lumbar spine surgery for decompression of spinal cord 02/17/2021   L 01/09/2021   Foot drop, left 01/05/2021   B12 deficiency 09/10/2019   Past Surgical History:  Procedure Laterality Date   APPENDECTOMY  1998   COLONOSCOPY  2019   JMP-MAC-suprep(good)-polyps   LUMBAR LAMINECTOMY/DECOMPRESSION MICRODISCECTOMY N/A 01/07/2021   Procedure: Lumbar three-lumbar four DECOMPRESSION  and  LEFT MICRODISCECTOMY;  Surgeon: Marybelle Killings, MD;  Location: [facility];  Service: Orthopedics;  Laterality: N/A;   POLYPECTOMY     UMBILICAL HERNIA REPAIR     as infant and again as an adult   VASECTOMY  2000   WISDOM TOOTH EXTRACTION      Family History  Problem Relation Age of Onset   Hypertension Mother    Hyperlipidemia Mother    Colon polyps Mother    Hypertension Father        age 28 in 2022   Hyperlipidemia Father    Hyperlipidemia Sister    Hyperlipidemia Brother    Colon cancer Neg Hx    Rectal cancer Neg Hx     Stomach cancer Neg Hx    Esophageal cancer Neg Hx     Medications- reviewed and updated Current Outpatient Medications  Medication Sig Dispense Refill   Cyanocobalamin (VITAMIN B-12 PO) Take 1 tablet by mouth every other day.     levothyroxine (SYNTHROID) 125 MCG tablet TAKE 1 TABLET BY MOUTH EVERY DAY 90 tablet 3   sildenafil (VIAGRA) 100 MG tablet TAKE 1/2 TO 1 TABLET BY MOUTH DAILY AS NEEDED FOR ERECTILE DYSFUNCTION (Patient taking differently: Take 50-100 mg by mouth as needed for erectile dysfunction.) 6 tablet 29   simvastatin (ZOCOR) 40 MG tablet TAKE 1 TABLET BY MOUTH EVERY DAY IN THE EVENING 90 tablet 3   No current facility-administered medications for this visit.    Allergies-reviewed and updated No Known Allergies  Social History   Social History Narrative   Divorced- lives with girlfriend. Daughter data scientist in her 47s, son Rosana Hoes doing better recently- has had substance issues in past- 2020.       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 Oropharynx normal Neck: no thyromegaly CV: RRR no murmurs rubs or gallops Lungs: CTAB no crackles, wheeze, rhonchi Abdomen: soft/nontender/nondistended/normal bowel sounds. No rebound or guarding.  Ext: no edema Skin: warm, dry Neuro: grossly normal, moves all extremities, PERRLA    Assessment and Plan  60 y.o. male presenting for annual physical.  Health Maintenance counseling:1. Anticipatory guidance: Patient counseled regarding regular dental exams -q6 months, eye exams -yearly,  avoiding smoking and second hand smoke- last 2 year he was occasionally smoking e-cigarettes- advised  against this, limiting alcohol to 2 beverages per day.   2. Risk factor reduction:  Advised patient of need for regular exercise and diet rich and fruits and vegetables to reduce risk of heart attack and stroke. Exercise- last year was doing some hiking on weekends (still doing this)  and also had an exercise bike- not using much. Considering sagewell.  Diet-could do this less or pick healthier options. Discussed 5-10 lbs off goal by today last year and he has lost 12 pounds!mainly doing water now- occasional soda. Has worked on portion sizes. Would love to see continued gradual weight loss Wt Readings from Last 3 Encounters:  10/01/21 247 lb 12.8 oz (112.4 kg)  08/07/21 260 lb (117.9 kg)  07/24/21 260 lb (117.9 kg)  3. Immunizations/screenings/ancillary studies- discuss Shingrix- opts in for #2 today and flu shot- opts out this year- otherwise up-to-date. Immunization History  Administered Date(s) Administered   Influenza Split 10/25/2011   Influenza,inj,Quad PF,6+ Mos 09/10/2019, 09/30/2020   PFIZER(Purple Top)SARS-COV-2 Vaccination 02/07/2020, 03/04/2020, 10/17/2020   Pfizer Covid-19 Vaccine Bivalent Booster 80yr & up 08/31/2021   Td 11/16/2003   Tdap 12/27/2013   Zoster Recombinat (Shingrix) 04/23/2021, 10/01/2021   4. Prostate cancer screening- trend PSA with labs today.  Defer rectal exam unless concerning PSA trend  Lab Results  Component Value Date   PSA 0.35 09/30/2020   PSA 0.42 09/10/2019   PSA 0.42 09/05/2018   5. Colon cancer screening - last done 08/07/2021 with a 3-year repeat planned 6. Skin cancer screening- skin tags removed back in 2018 - no recent dermatologist visits as of last year-we did place a referral (but he got really busy). advised regular sunscreen use. Denies worrisome, changing, or new skin lesions.  7. Smoking associated screening (lung cancer screening, AAA screen 65-75, UA)- former smoker- quit back in 2004. 25 pack years. He is not a candidate for lung  cancer screening considering how long ago he had quit. Urinalysis today 8. STD screening - opts out as monogamous  Status of chronic or acute concerns   #Social update-still with some ongoing stress and anxiety-unknown substance abuse history and caring for aging parents (brother helps a lot and that weighs on him)  - he is learning power of patience- taking time for son to dig out. Overall doing better  # Vertigo S:sometimes in the morning (in last week more common)when rolling over or get out of bed in AM will get a sensation of movement and feel dizzy with this. Usually happens this time of year. Not persistent through day- mainly just the AM.   - no increased tinnitus or hearing loss  A/P: mild issue- did discuss  possible vestibular rehab. He will try video from ENT- on AVS. If not improving or worsens should follow up. Could also  try meclizine over the counter  # Right knee pain- doing better this year- prior flare up calmed down  #Left groin pain/left testicular pain starting after lumbar decompression and left microdiscectomy S:Patient with herniated disc surgery in mid February due to herniated disc with Dr. Lorin Mercy.  Last year he reported having some soreness in his back from the sugery itself. Also had a similar normal intensity of pain in left groin/left testicle. He has also felt some pain in his groin that was worse on the left side and down into testicle and lower abdomen.   Patient also had increased  polyuria twice a night from once a night. - I thought that he also needed an exam to rule out hernia in the left groin or potential rectal exam-prostatitis in differential - also opted for patient to get left testicular ultrasound -  this was completed on 03/10/2021 and results showed a small hydrocele on each side, no intratesticular or extratesticular mass on either side, no orchitis or epididymitis on either side, no testicular torsion on either side. - has been monogamous with  girlfriend for extended period and we opted out of std testing A/P: ultimately groin pain thought related to lumbar surgery- improved with PT over time- now resolved- he will let us know if recurrence. Back still with intermittent issues   #hyperlipidemia S: Medication: simvastatin 40 mg daily in the PM.  Lab Results  Component Value Date   CHOL 179 09/30/2020   HDL 53 09/30/2020   LDLCALC 108 (H) 09/30/2020   TRIG 90 09/30/2020   CHOLHDL 3.4 09/30/2020   A/P: hopefully improved with weight loss- if LDL not under 100 consider increasing statin strength. Ultimate goal would be under 70  #hypothyroidism S: compliant On thyroid medication-levothyroxine 125 mcg daily   Lab Results  Component Value Date   TSH 4.61 (H) 09/30/2020    A/P:hopefully stable/improved- update tsh today   # B12 deficiency S: Current treatment/medication (oral vs. IM): Cyanocobalamin/Vitamin B-12 PO every other day  until ran out a month or two ago  Lab Results  Component Value Date   VITAMINB12 1,258 (H) 09/30/2020  A/P: Hopefully stable/in normal range despite being out of b12-update B12 labs today  #Numbness/B12 deficiency -ongoing issue with left hand in past.  Thought to be ulnar neuropathy related (no issues lataely). Had some prior foot tingling  in the past also- possibly related to back .  Has seen Dr. Junius Roads in the past.  B12 was low normal and had prior treatment he is taking oral B12 now-update B12 level as above- both areas of numbness stable  #ED- patient is compliant with sildenafil 100 mg as needed  Recommended follow up: No follow-ups on file.  Lab/Order associations: fasting   ICD-10-CM   1. Preventative health care  Z00.00 TSH    CBC with Differential/Platelet    Comprehensive metabolic panel    Lipid panel    PSA    POCT Urinalysis Dipstick (Automated)    2. Hyperlipidemia, unspecified hyperlipidemia type  E78.5 TSH    CBC with Differential/Platelet    Comprehensive metabolic panel     Lipid panel    POCT Urinalysis Dipstick (Automated)    3. Other specified hypothyroidism  E03.8 TSH    4. Former smoker  Z87.891 POCT Urinalysis Dipstick (Automated)    5. B12 deficiency  E53.8 Vitamin B12    6. Need for shingles vaccine  Z23 Varicella-zoster vaccine IM (Shingrix)    7. Screening for prostate cancer  Z12.5 PSA      No orders of the defined types were placed in this encounter.  I,Harris Phan,acting as a Education administrator for Garret Reddish, MD.,have documented all relevant documentation on the behalf of Garret Reddish, MD,as directed by  Garret Reddish, MD while in the presence of Garret Reddish, MD.   I, Garret Reddish, MD, have reviewed all documentation for this visit. The documentation on 10/01/21 for the exam, diagnosis, procedures, and orders are all accurate and complete.   Return precautions advised.  Garret Reddish, MD

## 2021-10-01 ENCOUNTER — Encounter: Payer: Self-pay | Admitting: Family Medicine

## 2021-10-01 ENCOUNTER — Other Ambulatory Visit: Payer: Self-pay

## 2021-10-01 ENCOUNTER — Ambulatory Visit (INDEPENDENT_AMBULATORY_CARE_PROVIDER_SITE_OTHER): Payer: 59 | Admitting: Family Medicine

## 2021-10-01 VITALS — BP 102/64 | HR 57 | Temp 97.9°F | Ht 76.0 in | Wt 247.8 lb

## 2021-10-01 DIAGNOSIS — Z23 Encounter for immunization: Secondary | ICD-10-CM

## 2021-10-01 DIAGNOSIS — E538 Deficiency of other specified B group vitamins: Secondary | ICD-10-CM | POA: Diagnosis not present

## 2021-10-01 DIAGNOSIS — Z125 Encounter for screening for malignant neoplasm of prostate: Secondary | ICD-10-CM | POA: Diagnosis not present

## 2021-10-01 DIAGNOSIS — Z0000 Encounter for general adult medical examination without abnormal findings: Secondary | ICD-10-CM

## 2021-10-01 DIAGNOSIS — E038 Other specified hypothyroidism: Secondary | ICD-10-CM

## 2021-10-01 DIAGNOSIS — Z87891 Personal history of nicotine dependence: Secondary | ICD-10-CM

## 2021-10-01 DIAGNOSIS — E785 Hyperlipidemia, unspecified: Secondary | ICD-10-CM | POA: Diagnosis not present

## 2021-10-01 LAB — COMPREHENSIVE METABOLIC PANEL
ALT: 19 U/L (ref 0–53)
AST: 19 U/L (ref 0–37)
Albumin: 4.6 g/dL (ref 3.5–5.2)
Alkaline Phosphatase: 50 U/L (ref 39–117)
BUN: 12 mg/dL (ref 6–23)
CO2: 31 mEq/L (ref 19–32)
Calcium: 9.5 mg/dL (ref 8.4–10.5)
Chloride: 102 mEq/L (ref 96–112)
Creatinine, Ser: 1.08 mg/dL (ref 0.40–1.50)
GFR: 75.24 mL/min (ref >60.00)
Glucose, Bld: 104 mg/dL — ABNORMAL HIGH (ref 70–99)
Potassium: 4.9 mEq/L (ref 3.5–5.1)
Sodium: 138 mEq/L (ref 135–145)
Total Bilirubin: 0.7 mg/dL (ref 0.2–1.2)
Total Protein: 6.8 g/dL (ref 6.0–8.3)

## 2021-10-01 LAB — LIPID PANEL
Cholesterol: 173 mg/dL (ref 0–200)
HDL: 52.1 mg/dL (ref >39.00)
LDL Cholesterol: 104 mg/dL — ABNORMAL HIGH (ref 0–99)
NonHDL: 120.57
Total CHOL/HDL Ratio: 3
Triglycerides: 85 mg/dL (ref 0.0–149.0)
VLDL: 17 mg/dL (ref 0.0–40.0)

## 2021-10-01 LAB — CBC WITH DIFFERENTIAL/PLATELET
Basophils Absolute: 0 10*3/uL (ref 0.0–0.1)
Basophils Relative: 0.7 % (ref 0.0–3.0)
Eosinophils Absolute: 0 10*3/uL (ref 0.0–0.7)
Eosinophils Relative: 0.1 % (ref 0.0–5.0)
HCT: 46.5 % (ref 39.0–52.0)
Hemoglobin: 15.8 g/dL (ref 13.0–17.0)
Lymphocytes Relative: 39.6 % (ref 12.0–46.0)
Lymphs Abs: 1.6 10*3/uL (ref 0.7–4.0)
MCHC: 34.1 g/dL (ref 30.0–36.0)
MCV: 90.3 fl (ref 78.0–100.0)
Monocytes Absolute: 0.4 10*3/uL (ref 0.1–1.0)
Monocytes Relative: 8.9 % (ref 3.0–12.0)
Neutro Abs: 2.1 10*3/uL (ref 1.4–7.7)
Neutrophils Relative %: 50.7 % (ref 43.0–77.0)
Platelets: 191 10*3/uL (ref 150.0–400.0)
RBC: 5.15 Mil/uL (ref 4.22–5.81)
RDW: 14.1 % (ref 11.5–15.5)
WBC: 4.1 10*3/uL (ref 4.0–10.5)

## 2021-10-01 LAB — TSH: TSH: 1.79 u[IU]/mL (ref 0.35–5.50)

## 2021-10-01 LAB — VITAMIN B12: Vitamin B-12: 520 pg/mL (ref 211–911)

## 2021-10-01 LAB — PSA: PSA: 0.52 ng/mL (ref 0.10–4.00)

## 2021-10-01 NOTE — Patient Instructions (Addendum)
Health Maintenance Due  Topic Date Due   INFLUENZA VACCINE  - Patient wants to hold off this year. If you decide to change your mind and have this done at a pharmacy please let us know via a message through [place].  06/15/2021   Please stop by lab before you go If you have mychart- we will send your results within 3 business days of [place] receiving them.  If you do not have mychart- we will call you about results within 5 business days of [place] receiving them.  *please also note that you will see labs on mychart as soon as they post. I will later go in and write notes on them- will say "notes from Dr. Yong Channel"   Richrd Sox, MD Vertigo Treatment Oct 11 [email or web address]  If your dizziness continues to persist or symptoms worsen over time, you can also try Meclizine over-the-counter OR let me know  You are down by 12 lbs since your last physical - keep up the great work and continue to stay consistent! Continue gradual weight loss that is sustainable for you - I would love to see you down by 5-10 lbs in a year.  Call Dermatology to schedule a follow-up.  If your LDL is over 100 , we may need to increase your statin strength.  Recommended follow up: Return in about 1 year (around 10/01/2022) for a physical.

## 2022-05-14 ENCOUNTER — Other Ambulatory Visit: Payer: Self-pay | Admitting: Family Medicine

## 2022-08-09 ENCOUNTER — Encounter: Payer: Self-pay | Admitting: *Deleted

## 2022-09-22 ENCOUNTER — Other Ambulatory Visit: Payer: Self-pay | Admitting: Family Medicine

## 2022-09-22 DIAGNOSIS — E785 Hyperlipidemia, unspecified: Secondary | ICD-10-CM

## 2022-10-13 ENCOUNTER — Encounter: Payer: Self-pay | Admitting: Family Medicine

## 2022-10-13 ENCOUNTER — Ambulatory Visit (INDEPENDENT_AMBULATORY_CARE_PROVIDER_SITE_OTHER): Payer: 59 | Admitting: Family Medicine

## 2022-10-13 VITALS — BP 122/68 | HR 61 | Temp 98.0°F | Ht 76.0 in | Wt 251.6 lb

## 2022-10-13 DIAGNOSIS — H9193 Unspecified hearing loss, bilateral: Secondary | ICD-10-CM

## 2022-10-13 DIAGNOSIS — E538 Deficiency of other specified B group vitamins: Secondary | ICD-10-CM

## 2022-10-13 DIAGNOSIS — Z23 Encounter for immunization: Secondary | ICD-10-CM | POA: Diagnosis not present

## 2022-10-13 DIAGNOSIS — E038 Other specified hypothyroidism: Secondary | ICD-10-CM

## 2022-10-13 DIAGNOSIS — Z125 Encounter for screening for malignant neoplasm of prostate: Secondary | ICD-10-CM

## 2022-10-13 DIAGNOSIS — Z0000 Encounter for general adult medical examination without abnormal findings: Secondary | ICD-10-CM | POA: Diagnosis not present

## 2022-10-13 DIAGNOSIS — E785 Hyperlipidemia, unspecified: Secondary | ICD-10-CM | POA: Diagnosis not present

## 2022-10-13 DIAGNOSIS — H9313 Tinnitus, bilateral: Secondary | ICD-10-CM

## 2022-10-13 DIAGNOSIS — Z87891 Personal history of nicotine dependence: Secondary | ICD-10-CM | POA: Diagnosis not present

## 2022-10-13 LAB — URINALYSIS, ROUTINE W REFLEX MICROSCOPIC
Bilirubin Urine: NEGATIVE
Hgb urine dipstick: NEGATIVE
Ketones, ur: NEGATIVE
Leukocytes,Ua: NEGATIVE
Nitrite: NEGATIVE
RBC / HPF: NONE SEEN (ref 0–?)
Specific Gravity, Urine: 1.015 (ref 1.000–1.030)
Total Protein, Urine: NEGATIVE
Urine Glucose: NEGATIVE
Urobilinogen, UA: 0.2 (ref 0.0–1.0)
pH: 7 (ref 5.0–8.0)

## 2022-10-13 LAB — LIPID PANEL
Cholesterol: 159 mg/dL (ref 0–200)
HDL: 48.7 mg/dL (ref >39.00)
LDL Cholesterol: 89 mg/dL (ref 0–99)
NonHDL: 110.04
Total CHOL/HDL Ratio: 3
Triglycerides: 104 mg/dL (ref 0.0–149.0)
VLDL: 20.8 mg/dL (ref 0.0–40.0)

## 2022-10-13 LAB — CBC WITH DIFFERENTIAL/PLATELET
Basophils Absolute: 0 10*3/uL (ref 0.0–0.1)
Basophils Relative: 0.9 % (ref 0.0–3.0)
Eosinophils Absolute: 0 10*3/uL (ref 0.0–0.7)
Eosinophils Relative: 0.1 % (ref 0.0–5.0)
HCT: 46.1 % (ref 39.0–52.0)
Hemoglobin: 16 g/dL (ref 13.0–17.0)
Lymphocytes Relative: 42.1 % (ref 12.0–46.0)
Lymphs Abs: 1.8 10*3/uL (ref 0.7–4.0)
MCHC: 34.7 g/dL (ref 30.0–36.0)
MCV: 91.2 fl (ref 78.0–100.0)
Monocytes Absolute: 0.4 10*3/uL (ref 0.1–1.0)
Monocytes Relative: 10.1 % (ref 3.0–12.0)
Neutro Abs: 2 10*3/uL (ref 1.4–7.7)
Neutrophils Relative %: 46.8 % (ref 43.0–77.0)
Platelets: 204 10*3/uL (ref 150.0–400.0)
RBC: 5.05 Mil/uL (ref 4.22–5.81)
RDW: 13.6 % (ref 11.5–15.5)
WBC: 4.3 10*3/uL (ref 4.0–10.5)

## 2022-10-13 LAB — COMPREHENSIVE METABOLIC PANEL
ALT: 26 U/L (ref 0–53)
AST: 22 U/L (ref 0–37)
Albumin: 4.6 g/dL (ref 3.5–5.2)
Alkaline Phosphatase: 46 U/L (ref 39–117)
BUN: 14 mg/dL (ref 6–23)
CO2: 31 mEq/L (ref 19–32)
Calcium: 9.5 mg/dL (ref 8.4–10.5)
Chloride: 104 mEq/L (ref 96–112)
Creatinine, Ser: 0.96 mg/dL (ref 0.40–1.50)
GFR: 86.03 mL/min (ref >60.00)
Glucose, Bld: 97 mg/dL (ref 70–99)
Potassium: 4.8 mEq/L (ref 3.5–5.1)
Sodium: 140 mEq/L (ref 135–145)
Total Bilirubin: 0.7 mg/dL (ref 0.2–1.2)
Total Protein: 6.7 g/dL (ref 6.0–8.3)

## 2022-10-13 LAB — VITAMIN B12: Vitamin B-12: 713 pg/mL (ref 211–911)

## 2022-10-13 LAB — PSA: PSA: 0.4 ng/mL (ref 0.10–4.00)

## 2022-10-13 LAB — TSH: TSH: 1.11 u[IU]/mL (ref 0.35–5.50)

## 2022-10-13 NOTE — Patient Instructions (Addendum)
Flu shot today  I like your idea about working at [employer]- lets get going on that again!   we discussed at least dropping 5-10 lbs in next year.   We will call you within two weeks about your referral to ENT for hearing/ringing in ears eval. If you do not hear within 2 weeks, give [place] a call.    Would love to see you quit e cigarettes  Please stop by lab before you go If you have mychart- we will send your results within 3 business days of [place] receiving them.  If you do not have mychart- we will call you about results within 5 business days of [place] receiving them.  *please also note that you will see labs on mychart as soon as they post. I will later go in and write notes on them- will say "notes from Dr. Yong Channel"   Recommended follow up: Return in about 1 year (around 10/14/2023) for physical or sooner if needed.Schedule b4 you leave.

## 2022-10-13 NOTE — Progress Notes (Signed)
Phone: [phone]   Subjective:  Patient presents today for their annual physical. Chief complaint-noted.   See problem oriented charting- ROS- full  review of systems was completed and negative  except for: tingling in feet, ringing in ears  The following were reviewed and entered/updated in epic: Past Medical History:  Diagnosis Date   Anxiety    a lot of stressors. panic attacks in younger years.   ED (erectile dysfunction)    GERD (gastroesophageal reflux disease)    very mild- with certain foods   Hyperlipidemia    on meds   Hypothyroidism    on meds   Thyroid disease    Tobacco abuse    Patient Active Problem List   Diagnosis Date Noted   B12 deficiency 09/10/2019    Priority: Medium    History of adenomatous polyp of colon 03/24/2018    Priority: Medium    Hypothyroidism 10/02/2007    Priority: Medium    Hyperlipidemia 10/02/2007    Priority: Medium    Former smoker 12/12/2012    Priority: Low   ERECTILE DYSFUNCTION 10/03/2008    Priority: Low   Status post lumbar spine surgery for decompression of spinal cord 02/17/2021    Priority: 1.   L 01/09/2021    Priority: 1.   Foot drop, left 01/05/2021    Priority: 1.   Past Surgical History:  Procedure Laterality Date   APPENDECTOMY  1998   COLONOSCOPY  2019   JMP-MAC-suprep(good)-polyps   LUMBAR LAMINECTOMY/DECOMPRESSION MICRODISCECTOMY N/A 01/07/2021   Procedure: Lumbar three-lumbar four DECOMPRESSION  and  LEFT MICRODISCECTOMY;  Surgeon: Marybelle Killings, MD;  Location: [place];  Service: Orthopedics;  Laterality: N/A;   POLYPECTOMY     UMBILICAL HERNIA REPAIR     as infant and again as an adult   VASECTOMY  2000   WISDOM TOOTH EXTRACTION      Family History  Problem Relation Age of Onset   Hypertension Mother        34 in 2023   Hyperlipidemia Mother    Colon polyps Mother    Hypertension Father        age 27 in 2023   Hyperlipidemia Father    Hyperlipidemia Sister    Hyperlipidemia Brother     Colon cancer Neg Hx    Rectal cancer Neg Hx    Stomach cancer Neg Hx    Esophageal cancer Neg Hx     Medications- reviewed and updated Current Outpatient Medications  Medication Sig Dispense Refill   Cyanocobalamin (VITAMIN B-12 PO) Take 1 tablet by mouth every other day.     levothyroxine (SYNTHROID) 125 MCG tablet TAKE 1 TABLET BY MOUTH EVERY DAY 90 tablet 3   sildenafil (VIAGRA) 100 MG tablet TAKE 1/2 TO 1 TABLET BY MOUTH DAILY AS NEEDED FOR ERECTILE DYSFUNCTION 6 tablet 29   simvastatin (ZOCOR) 40 MG tablet TAKE 1 TABLET BY MOUTH EVERY DAY IN THE EVENING 90 tablet 3   No current facility-administered medications for this visit.    Allergies-reviewed and updated No Known Allergies  Social History   Social History Narrative   Divorced- lives with girlfriend. Daughter data scientist in her 51s, son Rosana Hoes doing better recently- has had substance issues in past- 2020.       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 Oropharynx normal Neck: no thyromegaly CV: RRR no murmurs rubs or gallops Lungs: CTAB no crackles, wheeze, rhonchi Abdomen: soft/nontender/nondistended/normal bowel sounds. No rebound or guarding.  Ext: no edema Skin: warm, dry Neuro: grossly normal, moves all extremities, PERRLA   Assessment and Plan  61 y.o. male presenting for annual physical.  Health Maintenance counseling: 1. Anticipatory guidance: Patient counseled regarding regular dental exams -q6 months, eye exams -yearly,  avoiding smoking and second hand smoke- e cigs daily , limiting alcohol to 2 beverages per day - about 2 a day, no illicit drugs .   2. Risk factor  reduction:  Advised patient of need for regular exercise and diet rich and fruits and vegetables to reduce risk of heart attack and stroke.  Exercise- hiking and walking some - mainly on weekends if weather good. Has considered [facility]- did get tour. Some barrier for him- states socially getting out can be hard.  Diet/weight management-weight up 4 lbs from last year- encouraged pushing this weight down- has tried to work on portions and cutting down on sodas- we discussed at least dropping 5-10 lbs in next year.  Wt Readings from Last 3 Encounters:  10/13/22 251 lb 9.6 oz (114.1 kg)  10/01/21 247 lb 12.8 oz (112.4 kg)  08/07/21 260 lb (117.9 kg)  3. Immunizations/screenings/ancillary studies- flu shot today, RSV opt out for now  Immunization History  Administered Date(s) Administered   COVID-19, mRNA, vaccine(Comirnaty)12 years and older 08/23/2022   Influenza Split 10/25/2011   Influenza,inj,Quad PF,6+ Mos 09/10/2019, 09/30/2020   PFIZER(Purple Top)SARS-COV-2 Vaccination 02/07/2020, 03/04/2020, 10/17/2020, 08/23/2022   Pfizer Covid-19 Vaccine Bivalent Booster 74yr & up 08/31/2021   Td 11/16/2003   Tdap 12/27/2013   Zoster Recombinat (Shingrix) 04/23/2021, 10/01/2021  4. Prostate cancer screening-  low risk prior psa trend- update today Lab Results  Component Value Date   PSA 0.52 10/01/2021   PSA 0.35 09/30/2020   PSA 0.42 09/10/2019   5. Colon cancer screening - last done 08/07/2021 with a 3-year repeat planned  6. Skin cancer screening- skin tags removed back in 2018 - wants to hold off for now . advised regular sunscreen use. Denies worrisome, changing, or new skin lesions.  7. Smoking associated screening (lung cancer screening, AAA screen 65-75, UA)- former smoker- quit back in 2004. 25 pack years. He is not a candidate for lung cancer screening considering how long ago he had quit. Urinalysis today. Recommended quitting e cigs.   8. STD screening - opts out as  monogamous   Status of chronic or acute concerns   #hyperlipidemia S: Medication: simvastatin 478mLab Results  Component Value Date   CHOL 173 10/01/2021   HDL 52.10 10/01/2021   LDLCALC 104 (H) 10/01/2021   TRIG 85.0 10/01/2021   CHOLHDL 3 10/01/2021  A/P: LDL close to reasonable goal 100 or less at 104- update labs and focus on lifestyle to get this down further  #hypothyroidism S: compliant On thyroid medication- levothyroxine 125 mcg Lab Results  Component Value Date   TSH 1.79 10/01/2021  A/P:hopefully stable- update tsh today. Continue current meds for now   # B12 deficiency S: Current treatment/medication (oral vs. IM):  1000 mcg every other day or 3rd day A/P:  hopefully stable- update b12 today. Continue current meds for now   #Tinnitus S: several years of issues but worse in last year. Constant not pulsatile. Has noted some hearing loss. Called audiology but they said needed referral.  A/P: refer to ENT for their opinion- likely needs hearing tested  # paresthesias S:notes some tingling in both feet. History of lumbar laminectomy  and seemed to start after surgery. Not worsening. Worse after hiking. Mild irritant A/P: mild irritant- continue to monitor- if worsens consider further workup. With back history- imaging of back repeat could be reasonable or we could ask surgeon.   Recommended follow up: Return in about 1 year (around 10/14/2023) for physical or sooner if needed.Schedule b4 you leave.  Lab/Order associations: fasting- black coffee   ICD-10-CM   1. Preventative health care  Z00.00 TSH    CBC with Differential/Platelet    Comprehensive metabolic panel    Lipid panel    PSA    2. Other specified hypothyroidism  E03.8 TSH    3. Hyperlipidemia, unspecified hyperlipidemia type  E78.5 CBC with Differential/Platelet    Comprehensive metabolic panel    Lipid panel    4. Screening for prostate cancer  Z12.5 PSA    5. B12 deficiency  E53.8 Vitamin B12     6. Former smoker  Z87.891 Urinalysis, Routine w reflex microscopic    7. Tinnitus of both ears  H93.13 Ambulatory referral to ENT    8. Bilateral hearing loss, unspecified hearing loss type  H91.93 Ambulatory referral to ENT     No orders of the defined types were placed in this encounter.  Return precautions advised.  Garret Reddish, MD

## 2023-01-31 DIAGNOSIS — H903 Sensorineural hearing loss, bilateral: Secondary | ICD-10-CM | POA: Insufficient documentation

## 2023-01-31 DIAGNOSIS — H9313 Tinnitus, bilateral: Secondary | ICD-10-CM | POA: Insufficient documentation

## 2023-05-19 ENCOUNTER — Other Ambulatory Visit: Payer: Self-pay | Admitting: Family Medicine

## 2023-09-28 ENCOUNTER — Other Ambulatory Visit: Payer: Self-pay | Admitting: Family Medicine

## 2023-09-28 DIAGNOSIS — E785 Hyperlipidemia, unspecified: Secondary | ICD-10-CM

## 2023-10-18 ENCOUNTER — Encounter: Payer: Self-pay | Admitting: Family Medicine

## 2023-10-18 ENCOUNTER — Ambulatory Visit: Payer: 59 | Admitting: Family Medicine

## 2023-10-18 VITALS — BP 116/64 | HR 62 | Temp 98.4°F | Ht 76.0 in | Wt 250.2 lb

## 2023-10-18 DIAGNOSIS — Z131 Encounter for screening for diabetes mellitus: Secondary | ICD-10-CM

## 2023-10-18 DIAGNOSIS — Z0000 Encounter for general adult medical examination without abnormal findings: Secondary | ICD-10-CM

## 2023-10-18 DIAGNOSIS — H903 Sensorineural hearing loss, bilateral: Secondary | ICD-10-CM

## 2023-10-18 DIAGNOSIS — E669 Obesity, unspecified: Secondary | ICD-10-CM

## 2023-10-18 DIAGNOSIS — Z87891 Personal history of nicotine dependence: Secondary | ICD-10-CM

## 2023-10-18 DIAGNOSIS — E538 Deficiency of other specified B group vitamins: Secondary | ICD-10-CM | POA: Diagnosis not present

## 2023-10-18 DIAGNOSIS — E038 Other specified hypothyroidism: Secondary | ICD-10-CM | POA: Diagnosis not present

## 2023-10-18 DIAGNOSIS — E785 Hyperlipidemia, unspecified: Secondary | ICD-10-CM | POA: Diagnosis not present

## 2023-10-18 DIAGNOSIS — Z125 Encounter for screening for malignant neoplasm of prostate: Secondary | ICD-10-CM

## 2023-10-18 DIAGNOSIS — Z23 Encounter for immunization: Secondary | ICD-10-CM

## 2023-10-18 LAB — CBC WITH DIFFERENTIAL/PLATELET
Basophils Absolute: 0 10*3/uL (ref 0.0–0.1)
Basophils Relative: 0.7 % (ref 0.0–3.0)
Eosinophils Absolute: 0 10*3/uL (ref 0.0–0.7)
Eosinophils Relative: 0.1 % (ref 0.0–5.0)
HCT: 48.1 % (ref 39.0–52.0)
Hemoglobin: 16.3 g/dL (ref 13.0–17.0)
Lymphocytes Relative: 36.6 % (ref 12.0–46.0)
Lymphs Abs: 1.6 10*3/uL (ref 0.7–4.0)
MCHC: 33.9 g/dL (ref 30.0–36.0)
MCV: 92.8 fL (ref 78.0–100.0)
Monocytes Absolute: 0.4 10*3/uL (ref 0.1–1.0)
Monocytes Relative: 8.4 % (ref 3.0–12.0)
Neutro Abs: 2.4 10*3/uL (ref 1.4–7.7)
Neutrophils Relative %: 54.2 % (ref 43.0–77.0)
Platelets: 227 10*3/uL (ref 150.0–400.0)
RBC: 5.19 Mil/uL (ref 4.22–5.81)
RDW: 13.7 % (ref 11.5–15.5)
WBC: 4.3 10*3/uL (ref 4.0–10.5)

## 2023-10-18 LAB — LIPID PANEL
Cholesterol: 178 mg/dL (ref 0–200)
HDL: 50.9 mg/dL (ref >39.00)
LDL Cholesterol: 108 mg/dL — ABNORMAL HIGH (ref 0–99)
NonHDL: 127.41
Total CHOL/HDL Ratio: 4
Triglycerides: 96 mg/dL (ref 0.0–149.0)
VLDL: 19.2 mg/dL (ref 0.0–40.0)

## 2023-10-18 LAB — COMPREHENSIVE METABOLIC PANEL
ALT: 24 U/L (ref 0–53)
AST: 23 U/L (ref 0–37)
Albumin: 4.7 g/dL (ref 3.5–5.2)
Alkaline Phosphatase: 51 U/L (ref 39–117)
BUN: 11 mg/dL (ref 6–23)
CO2: 31 meq/L (ref 19–32)
Calcium: 9.9 mg/dL (ref 8.4–10.5)
Chloride: 103 meq/L (ref 96–112)
Creatinine, Ser: 0.97 mg/dL (ref 0.40–1.50)
GFR: 84.37 mL/min (ref >60.00)
Glucose, Bld: 95 mg/dL (ref 70–99)
Potassium: 4.7 meq/L (ref 3.5–5.1)
Sodium: 139 meq/L (ref 135–145)
Total Bilirubin: 0.8 mg/dL (ref 0.2–1.2)
Total Protein: 7 g/dL (ref 6.0–8.3)

## 2023-10-18 LAB — URINALYSIS, ROUTINE W REFLEX MICROSCOPIC
Bilirubin Urine: NEGATIVE
Hgb urine dipstick: NEGATIVE
Ketones, ur: NEGATIVE
Leukocytes,Ua: NEGATIVE
Nitrite: NEGATIVE
RBC / HPF: NONE SEEN (ref 0–?)
Specific Gravity, Urine: <=1.005 — AB (ref 1.000–1.030)
Total Protein, Urine: NEGATIVE
Urine Glucose: NEGATIVE
Urobilinogen, UA: 0.2 (ref 0.0–1.0)
WBC, UA: NONE SEEN (ref 0–?)
pH: 6.5 (ref 5.0–8.0)

## 2023-10-18 LAB — VITAMIN B12: Vitamin B-12: 624 pg/mL (ref 211–911)

## 2023-10-18 LAB — TSH: TSH: 0.42 u[IU]/mL (ref 0.35–5.50)

## 2023-10-18 LAB — HEMOGLOBIN A1C: Hgb A1c MFr Bld: 5.8 % (ref 4.6–6.5)

## 2023-10-18 LAB — PSA: PSA: 0.56 ng/mL (ref 0.10–4.00)

## 2023-10-18 NOTE — Patient Instructions (Addendum)
Flu shot before you leave  Tetanus, Diphtheria, and Pertussis (Tdap) today as well  Please stop by lab before you go If you have mychart- we will send your results within 3 business days of [place] receiving them.  If you do not have mychart- we will call you about results within 5 business days of [place] receiving them.  *please also note that you will see labs on mychart as soon as they post. I will later go in and write notes on them- will say "notes from Dr. Durene Cal"   Recommended follow up: Return in about 1 year (around 10/17/2024) for physical or sooner if needed.Schedule b4 you leave.

## 2023-10-18 NOTE — Progress Notes (Signed)
Phone: [phone]   Subjective:  Patient presents today for their annual physical. Chief complaint-noted.   See problem oriented charting- ROS- full  review of systems was completed and negative  except for: tingling in feet, tinnitus better  The following were reviewed and entered/updated in epic: Past Medical History:  Diagnosis Date   Anxiety    a lot of stressors. panic attacks in younger years.   ED (erectile dysfunction)    GERD (gastroesophageal reflux disease)    very mild- with certain foods   Hyperlipidemia    on meds   Hypothyroidism    on meds   Thyroid disease    Tobacco abuse    Patient Active Problem List   Diagnosis Date Noted   Bilateral sensorineural hearing loss 01/31/2023    Priority: Medium    B12 deficiency 09/10/2019    Priority: Medium    History of adenomatous polyp of colon 03/24/2018    Priority: Medium    Hypothyroidism 10/02/2007    Priority: Medium    Hyperlipidemia 10/02/2007    Priority: Medium    Tinnitus of both ears 01/31/2023    Priority: Low   Former smoker 12/12/2012    Priority: Low   ERECTILE DYSFUNCTION 10/03/2008    Priority: Low   Status post lumbar spine surgery for decompression of spinal cord 02/17/2021    Priority: 1.   L 01/09/2021    Priority: 1.   Foot drop, left 01/05/2021    Priority: 1.   Past Surgical History:  Procedure Laterality Date   APPENDECTOMY  1998   COLONOSCOPY  2019   JMP-MAC-suprep(good)-polyps   HERNIA REPAIR     LUMBAR LAMINECTOMY/DECOMPRESSION MICRODISCECTOMY N/A 01/07/2021   Procedure: Lumbar three-lumbar four DECOMPRESSION  and  LEFT MICRODISCECTOMY;  Surgeon: Eldred Manges, MD;  Location: [facility] OR;  Service: Orthopedics;  Laterality: N/A;   POLYPECTOMY     SPINE SURGERY  12/2020   UMBILICAL HERNIA REPAIR     as infant and again as an adult   VASECTOMY  2000   WISDOM TOOTH EXTRACTION      Family History  Problem Relation Age of Onset   Hypertension Mother        45 in 2023    Hyperlipidemia Mother    Colon polyps Mother    Hypertension Father        age 38 in 2023   Hyperlipidemia Father    Hearing loss Father    Hyperlipidemia Sister    Hyperlipidemia Brother    Colon cancer Neg Hx    Rectal cancer Neg Hx    Stomach cancer Neg Hx    Esophageal cancer Neg Hx     Medications- reviewed and updated Current Outpatient Medications  Medication Sig Dispense Refill   Cyanocobalamin (VITAMIN B-12 PO) Take 1 tablet by mouth every other day.     levothyroxine (SYNTHROID) 125 MCG tablet TAKE 1 TABLET BY MOUTH EVERY DAY 90 tablet 3   sildenafil (VIAGRA) 100 MG tablet TAKE 1/2 TO 1 TABLET BY MOUTH DAILY AS NEEDED FOR ERECTILE DYSFUNCTION 6 tablet 29   simvastatin (ZOCOR) 40 MG tablet TAKE 1 TABLET BY MOUTH EVERY DAY IN THE EVENING 90 tablet 3   No current facility-administered medications for this visit.    Allergies-reviewed and updated No Known Allergies  Social History   Social History Narrative   Divorced- lives with girlfriend. Daughter data scientist in her 47s, son Earlene Plater doing better recently- has had substance issues in past- 2020.  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 Oropharynx normal Neck: no thyromegaly CV: RRR no murmurs rubs or gallops Lungs: CTAB no crackles, wheeze, rhonchi Abdomen: soft/nontender/nondistended/normal bowel sounds. No rebound or guarding.  Ext: no edema Skin: warm, dry Neuro: grossly normal, moves all extremities, PERRLA declines genitourinary and rectal exam   Assessment and Plan  62 y.o. male presenting for annual physical.  Health Maintenance counseling: 1.  Anticipatory guidance: Patient counseled regarding regular dental exams -q6 months, eye exams -yearly,  avoiding smoking and second hand smoke-see below, limiting alcohol to 2 beverages per day-Down to 12 per week from 14, no illicit drugs .   2. Risk factor reduction:  Advised patient of need for regular exercise and diet rich and fruits and vegetables to reduce risk of heart attack and stroke.  Exercise-last year was doing some hiking and walking mainly on the weekends if weather was good.  Had considered [facility].  Today reports largely unchanged- hiking in daylight and with better weather- getting more steps right now and stairs with having to sleep upstairs with home remodel. Would love to see him intensify exercise and get 150 minutes a week Diet/weight management-weight down 1 pound from last year. Discussed watching portions as feels eating reasonably well Wt Readings from Last 3 Encounters:  10/18/23 250 lb 3.2 oz (113.5 kg)  10/13/22 251 lb 9.6 oz (114.1 kg)  10/01/21 247 lb 12.8 oz (112.4 kg)  3. Immunizations/screenings/ancillary studies-flu shot today, holding off on RSV with overall health being good as well as COVID- plans at pharmacy.  Discussed will be due for Tdap next year  Immunization History  Administered Date(s) Administered   Influenza Split 10/25/2011   Influenza,inj,Quad PF,6+ Mos 09/10/2019, 09/30/2020, 10/13/2022   PFIZER(Purple Top)SARS-COV-2 Vaccination 02/07/2020, 03/04/2020, 10/17/2020, 08/23/2022   Pfizer Covid-19 Vaccine Bivalent Booster 82yrs & up 08/31/2021   Pfizer(Comirnaty)Fall Seasonal Vaccine 12 years and older 08/23/2022   Td 11/16/2003   Tdap 12/27/2013   Zoster Recombinant(Shingrix) 04/23/2021, 10/01/2021   4. Prostate cancer screening-  low risk prior trend- update psa today  Lab Results  Component Value Date   PSA 0.40 10/13/2022   PSA 0.52 10/01/2021   PSA 0.35 09/30/2020   5. Colon cancer screening - last done 08/07/2021 with a 3-year repeat  planned  6. Skin cancer screening- skin tags removed back in 2018 - wants to hold off for now.  advised regular sunscreen use. Denies worrisome, changing, or new skin lesions.  7. Smoking associated screening (lung cancer screening, AAA screen 65-75, UA)- former smoker- quit back in 2004. 25 pack years. He is not a candidate for lung cancer screening considering how long ago he had quit. Urinalysis today. Recommended quitting e cigarettes again  8. STD screening - opts out as monogamous  Status of chronic or acute concerns   #hyperlipidemia S: Medication: simvastatin 40 mg Lab Results  Component Value Date   CHOL 159 10/13/2022   HDL 48.70 10/13/2022   LDLCALC 89 10/13/2022   TRIG 104.0 10/13/2022   CHOLHDL 3 10/13/2022  A/P: very  mild elevation but tolerating LDL under 100 with overall health - discussed prediabetes risk- check a1c  #hypothyroidism S: compliant On thyroid medication- levothyroxine 125 mcg Lab Results  Component Value Date   TSH 1.11 10/13/2022  A/P:hopefully stable- update tsh today. Continue current meds for now    # B12 deficiency S: Current treatment/medication (oral vs. IM):  1000 mcg twice weekly Lab Results  Component Value Date   VITAMINB12 713 10/13/2022  A/P: hopefully stable- update b12 today. Continue current meds for now    #Tinnitus S: several years of issues- better in last year A/P: saw audiology- borderline candidate for hearing aids- holding off  # paresthesias S:notes some tingling in both feet. History of lumbar laminectomy  and seemed to start after surgery. Not worsening. Worse after hiking and with cool weather. Mild irritant. A/P: ongoing issues- worse right now in winter - will monitor   Recommended follow up: Return in about 1 year (around 10/17/2024) for physical or sooner if needed.Schedule b4 you leave.  Lab/Order associations: fasting   ICD-10-CM   1. Preventative health care  Z00.00     2. Need for influenza vaccination   Z23     3. Other specified hypothyroidism  E03.8     4. Hyperlipidemia, unspecified hyperlipidemia type  E78.5     5. B12 deficiency  E53.8     6. Screening for prostate cancer  Z12.5     7. Former smoker  Z87.891     8. Bilateral sensorineural hearing loss  H90.3     9. Screening for diabetes mellitus  Z13.1     10. Obesity (BMI 30-39.9)  E66.9       No orders of the defined types were placed in this encounter.  Return precautions advised.  Tana Conch, MD

## 2024-09-23 ENCOUNTER — Other Ambulatory Visit: Payer: Self-pay | Admitting: Family Medicine

## 2024-09-23 DIAGNOSIS — E785 Hyperlipidemia, unspecified: Secondary | ICD-10-CM

## 2024-10-19 ENCOUNTER — Ambulatory Visit: Payer: Self-pay | Admitting: Family Medicine

## 2024-10-19 ENCOUNTER — Encounter: Payer: Self-pay | Admitting: Family Medicine

## 2024-10-19 ENCOUNTER — Ambulatory Visit: Payer: 59 | Admitting: Family Medicine

## 2024-10-19 VITALS — BP 118/74 | HR 60 | Temp 97.3°F | Ht 76.0 in | Wt 246.6 lb

## 2024-10-19 DIAGNOSIS — Z87891 Personal history of nicotine dependence: Secondary | ICD-10-CM

## 2024-10-19 DIAGNOSIS — E538 Deficiency of other specified B group vitamins: Secondary | ICD-10-CM

## 2024-10-19 DIAGNOSIS — Z125 Encounter for screening for malignant neoplasm of prostate: Secondary | ICD-10-CM

## 2024-10-19 DIAGNOSIS — E669 Obesity, unspecified: Secondary | ICD-10-CM

## 2024-10-19 DIAGNOSIS — E785 Hyperlipidemia, unspecified: Secondary | ICD-10-CM | POA: Diagnosis not present

## 2024-10-19 DIAGNOSIS — Z1211 Encounter for screening for malignant neoplasm of colon: Secondary | ICD-10-CM

## 2024-10-19 DIAGNOSIS — Z23 Encounter for immunization: Secondary | ICD-10-CM

## 2024-10-19 DIAGNOSIS — Z0000 Encounter for general adult medical examination without abnormal findings: Secondary | ICD-10-CM | POA: Diagnosis not present

## 2024-10-19 DIAGNOSIS — Z131 Encounter for screening for diabetes mellitus: Secondary | ICD-10-CM

## 2024-10-19 DIAGNOSIS — E038 Other specified hypothyroidism: Secondary | ICD-10-CM | POA: Diagnosis not present

## 2024-10-19 LAB — COMPREHENSIVE METABOLIC PANEL WITH GFR
ALT: 25 U/L (ref 0–53)
AST: 24 U/L (ref 0–37)
Albumin: 4.8 g/dL (ref 3.5–5.2)
Alkaline Phosphatase: 49 U/L (ref 39–117)
BUN: 14 mg/dL (ref 6–23)
CO2: 29 meq/L (ref 19–32)
Calcium: 9.7 mg/dL (ref 8.4–10.5)
Chloride: 102 meq/L (ref 96–112)
Creatinine, Ser: 0.94 mg/dL (ref 0.40–1.50)
GFR: 86.99 mL/min (ref >60.00)
Glucose, Bld: 92 mg/dL (ref 70–99)
Potassium: 4.6 meq/L (ref 3.5–5.1)
Sodium: 138 meq/L (ref 135–145)
Total Bilirubin: 0.9 mg/dL (ref 0.2–1.2)
Total Protein: 6.8 g/dL (ref 6.0–8.3)

## 2024-10-19 LAB — CBC WITH DIFFERENTIAL/PLATELET
Basophils Absolute: 0 K/uL (ref 0.0–0.1)
Basophils Relative: 0.8 % (ref 0.0–3.0)
Eosinophils Absolute: 0 K/uL (ref 0.0–0.7)
Eosinophils Relative: 0.1 % (ref 0.0–5.0)
HCT: 46 % (ref 39.0–52.0)
Hemoglobin: 16.1 g/dL (ref 13.0–17.0)
Lymphocytes Relative: 46.2 % — ABNORMAL HIGH (ref 12.0–46.0)
Lymphs Abs: 1.9 K/uL (ref 0.7–4.0)
MCHC: 35.1 g/dL (ref 30.0–36.0)
MCV: 89.7 fl (ref 78.0–100.0)
Monocytes Absolute: 0.3 K/uL (ref 0.1–1.0)
Monocytes Relative: 8.4 % (ref 3.0–12.0)
Neutro Abs: 1.8 K/uL (ref 1.4–7.7)
Neutrophils Relative %: 44.5 % (ref 43.0–77.0)
Platelets: 196 K/uL (ref 150.0–400.0)
RBC: 5.13 Mil/uL (ref 4.22–5.81)
RDW: 13.3 % (ref 11.5–15.5)
WBC: 4.1 K/uL (ref 4.0–10.5)

## 2024-10-19 LAB — URINALYSIS, ROUTINE W REFLEX MICROSCOPIC
Bilirubin Urine: NEGATIVE
Hgb urine dipstick: NEGATIVE
Ketones, ur: NEGATIVE
Leukocytes,Ua: NEGATIVE
Nitrite: NEGATIVE
RBC / HPF: NONE SEEN (ref 0–?)
Specific Gravity, Urine: 1.015 (ref 1.000–1.030)
Total Protein, Urine: NEGATIVE
Urine Glucose: NEGATIVE
Urobilinogen, UA: 0.2 (ref 0.0–1.0)
WBC, UA: NONE SEEN (ref 0–?)
pH: 6.5 (ref 5.0–8.0)

## 2024-10-19 LAB — LIPID PANEL
Cholesterol: 177 mg/dL (ref 0–200)
HDL: 52 mg/dL (ref >39.00)
LDL Cholesterol: 106 mg/dL — ABNORMAL HIGH (ref 0–99)
NonHDL: 124.89
Total CHOL/HDL Ratio: 3
Triglycerides: 92 mg/dL (ref 0.0–149.0)
VLDL: 18.4 mg/dL (ref 0.0–40.0)

## 2024-10-19 LAB — VITAMIN B12: Vitamin B-12: 592 pg/mL (ref 211–911)

## 2024-10-19 LAB — HEMOGLOBIN A1C: Hgb A1c MFr Bld: 5.6 % (ref 4.6–6.5)

## 2024-10-19 LAB — PSA: PSA: 1.03 ng/mL (ref 0.10–4.00)

## 2024-10-19 LAB — TSH: TSH: 0.67 u[IU]/mL (ref 0.35–5.50)

## 2024-10-19 NOTE — Progress Notes (Signed)
 Phone: [phone]   Subjective:  Patient presents today for their annual physical. Chief complaint-noted.   See problem oriented charting- ROS- full  review of systems was completed and negative  except for topics noted under acute/chronic concerns  The following were reviewed and entered/updated in epic: Past Medical History:  Diagnosis Date   Anxiety    a lot of stressors. panic attacks in younger years.   ED (erectile dysfunction)    GERD (gastroesophageal reflux disease)    very mild- with certain foods   Hyperlipidemia    on meds   Hypothyroidism    on meds   Thyroid  disease    Tobacco abuse    Patient Active Problem List   Diagnosis Date Noted   Bilateral sensorineural hearing loss 01/31/2023    Priority: Medium    B12 deficiency 09/10/2019    Priority: Medium    History of adenomatous polyp of colon 03/24/2018    Priority: Medium    Hypothyroidism 10/02/2007    Priority: Medium    Hyperlipidemia 10/02/2007    Priority: Medium    Tinnitus of both ears 01/31/2023    Priority: Low   Former smoker 12/12/2012    Priority: Low   ERECTILE DYSFUNCTION 10/03/2008    Priority: Low   Status post lumbar spine surgery for decompression of spinal cord 02/17/2021    Priority: 1.   L 01/09/2021    Priority: 1.   Foot drop, left 01/05/2021    Priority: 1.   Past Surgical History:  Procedure Laterality Date   APPENDECTOMY  1998   COLONOSCOPY  2019   JMP-MAC-suprep(good)-polyps   HERNIA REPAIR     LUMBAR LAMINECTOMY/DECOMPRESSION MICRODISCECTOMY N/A 01/07/2021   Procedure: Lumbar three-lumbar four DECOMPRESSION  and  LEFT MICRODISCECTOMY;  Surgeon: Barbarann Oneil BROCKS, MD;  Location: [facility] OR;  Service: Orthopedics;  Laterality: N/A;   POLYPECTOMY     SPINE SURGERY  12/2020   UMBILICAL HERNIA REPAIR     as infant and again as an adult   VASECTOMY  2000   WISDOM TOOTH EXTRACTION      Family History  Problem Relation Age of Onset   Hypertension Mother        65 in 2023    Hyperlipidemia Mother    Colon polyps Mother    Hypertension Father        age 42 in 2023   Hyperlipidemia Father    Hearing loss Father    Hyperlipidemia Sister    Hyperlipidemia Brother    Colon cancer Neg Hx    Rectal cancer Neg Hx    Stomach cancer Neg Hx    Esophageal cancer Neg Hx     Medications- reviewed and updated Current Outpatient Medications  Medication Sig Dispense Refill   Cyanocobalamin  (VITAMIN B-12 PO) Take 1 tablet by mouth every other day.     levothyroxine  (SYNTHROID ) 125 MCG tablet TAKE 1 TABLET BY MOUTH EVERY DAY 90 tablet 3   sildenafil  (VIAGRA ) 100 MG tablet TAKE 1/2 TO 1 TABLET BY MOUTH DAILY AS NEEDED FOR ERECTILE DYSFUNCTION 6 tablet 29   simvastatin  (ZOCOR ) 40 MG tablet TAKE 1 TABLET BY MOUTH EVERY DAY IN THE EVENING 90 tablet 3   No current facility-administered medications for this visit.    Allergies-reviewed and updated No Known Allergies  Social History   Social History Narrative   Remarried 2025 to long term girlfriend-prior divorced. Daughter data scientist in her 2s, son Nicholaus doing better recently- has had substance issues in  past- 2020.       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 Oropharynx normal Neck: no thyromegaly CV: RRR no murmurs rubs or gallops Lungs: CTAB no crackles, wheeze, rhonchi Abdomen: soft/nontender/nondistended/normal bowel sounds. No rebound or guarding.  Ext: no edema Skin: warm, dry Neuro: grossly normal, moves all extremities, PERRLA   Assessment and Plan  63 y.o. male presenting for  annual physical.  Health Maintenance counseling: 1. Anticipatory guidance: Patient counseled regarding regular dental exams -q6 months, eye exams -yearly,  avoiding smoking and second hand smoke- e cigarettes still , limiting alcohol to 2 beverages per day -12 per week 1-2 per night, no illicit drugs .   2. Risk factor reduction:  Advised patient of need for regular exercise and diet rich and fruits and vegetables to reduce risk of heart attack and stroke. Exercise- some hiking and walking- rougher this time of year.  Has considered water aerobics at [facility].  Diet/weight management-down 4 lbs from last year- eating out less and moving more Wt Readings from Last 3 Encounters:  10/19/24 246 lb 9.6 oz (111.9 kg)  10/18/23 250 lb 3.2 oz (113.5 kg)  10/13/22 251 lb 9.6 oz (114.1 kg)  3. Immunizations/screenings/ancillary studies-flu shot today. Prevnar 20 plans at 65 at latest - he wants to hold off  Immunization History  Administered Date(s) Administered   Influenza Split 10/25/2011   Influenza, Seasonal, Injecte, Preservative Fre 10/18/2023, 10/19/2024   Influenza,inj,Quad PF,6+ Mos 09/10/2019, 09/30/2020, 10/13/2022   PFIZER(Purple Top)SARS-COV-2 Vaccination 02/07/2020, 03/04/2020, 10/17/2020, 08/23/2022   Pfizer Covid-19 Vaccine Bivalent Booster 66yrs & up 08/31/2021   Pfizer(Comirnaty)Fall Seasonal Vaccine 12 years and older 08/23/2022   Td 11/16/2003   Tdap 12/27/2013, 10/18/2023   Zoster Recombinant(Shingrix) 04/23/2021, 10/01/2021  4. Prostate cancer screening-  low risk prior trend- update psa today  Lab Results  Component Value Date   PSA 0.56 10/18/2023   PSA 0.40 10/13/2022   PSA 0.52 10/01/2021   5. Colon cancer screening -  last done 08/07/2021 with a 3-year repeat planned . Refer back today 6. Skin cancer screening-has had skin checks last 2 years- good report.   Some skin tags removed years ago.advised regular sunscreen use. Denies worrisome, changing, or new skin lesions.   7. Smoking associated screening (lung cancer screening, AAA screen 65-75, UA)-former smoker- 25 pack years.  Quit in 2004 so not a candidate for lung cancer screening.  Urinalysis yearly.  Still recommend quitting e-cigarettes 8. STD screening - monogamous and not needed  Status of chronic or acute concerns   #social update- wife with breast cancer in June 2025 - parents likely skilled care in 2025 as well -still considering retiring in 2026  #hyperlipidemia S: Medication: simvastatin  40 mg daily Lab Results  Component Value Date   CHOL 178 10/18/2023   HDL 50.90 10/18/2023   LDLCALC 108 (H) 10/18/2023   TRIG 96.0 10/18/2023   CHOLHDL 4 10/18/2023  A/P: mild elevation last year with LDL 108- has wanted to work on  lifestyle to bring this down -opts in for CT calcium to see if we need to be more aggressive with goals  #hypothyroidism S: compliant On thyroid  medication- levothyroxine  125 mcg  Lab Results  Component Value Date   TSH 0.42 10/18/2023  A/P: hopefully stable- update tsh today. Continue current meds for now   # B12 deficiency S: Current treatment/medication (oral vs. IM):  1000 mcg 3 times a week or so A/P: well controlled last year- update today   #Tinnitus S: several years of issues but stable A/P: hearing tests every 2 years  # paresthesias S:notes some tingling in both feet. History of lumbar laminectomy  and seemed to start after surgery. Not worsening. Worse after hiking. Mild irritant A/P: still noted but only mild. 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 Continue without meds for now   Recommended follow up: Return in about 1 year (around 10/19/2025) for physical or sooner if  needed.Schedule b4 you leave.  Lab/Order associations: fasting   ICD-10-CM   1. Preventative health care  Z00.00     2. Immunization due  Z23 Flu vaccine trivalent PF, 6mos and older(Flulaval,Afluria,Fluarix,Fluzone)    3. Screening for colon cancer  Z12.11     4. Screening for diabetes mellitus  Z13.1     5. Obesity (BMI 30-39.9)  E66.9     6. Former smoker  Z87.891     7. B12 deficiency  E53.8     8. Other specified hypothyroidism  E03.8     9. Screening for prostate cancer  Z12.5     10. Hyperlipidemia, unspecified hyperlipidemia type  E78.5       No orders of the defined types were placed in this encounter.   Return precautions advised.  Garnette Lukes, MD

## 2024-10-19 NOTE — Patient Instructions (Addendum)
 Thanks for doing flu shot today  [facility] contact Please call to schedule visit and/or procedure IF you do not hear within a week Address: [address] [place], [place], [place] [address] Phone: [phone]   Please stop by lab before you go If you have mychart- we will send your results within 3 business days of us  receiving them.  If you do not have mychart- we will call you about results within 5 business days of us  receiving them.  *please also note that you will see labs on mychart as soon as they post. I will later go in and write notes on them- will say notes from Dr. Katrinka   We will call you within two weeks about your referral to CT calcium through [facility] ($150 cash pay).  Their phone number is [phone].  Please call them if you have not heard in 1-2 weeks   Recommended follow up: Return in about 1 year (around 10/19/2025) for physical or sooner if needed.Schedule b4 you leave.

## 2024-10-24 NOTE — Progress Notes (Signed)
 Spoke with patient directly . Patient stated understanding of results.

## 2024-10-29 ENCOUNTER — Inpatient Hospital Stay: Admission: RE | Admit: 2024-10-29 | Discharge: 2024-10-29 | Attending: Family Medicine | Admitting: Family Medicine

## 2024-10-29 DIAGNOSIS — E785 Hyperlipidemia, unspecified: Secondary | ICD-10-CM

## 2024-11-06 ENCOUNTER — Encounter: Payer: Self-pay | Admitting: Family Medicine

## 2024-11-06 DIAGNOSIS — Z0389 Encounter for observation for other suspected diseases and conditions ruled out: Secondary | ICD-10-CM

## 2024-11-07 MED ORDER — ROSUVASTATIN CALCIUM 40 MG PO TABS: 40.0000 mg | ORAL_TABLET | Freq: Every day | ORAL | 3 refills | Status: AC

## 2024-11-07 NOTE — Telephone Encounter (Signed)
 Spoke directly with patient and gave him the contact information to Cardiology. He expressed understanding that if he has not heard from Cardiology in a week to call and schedule the appointment himself. Patient also stated understanding of change in medication.

## 2024-11-07 NOTE — Telephone Encounter (Signed)
 Placed referral ad also stop Simvastatin  and ordered Rosuvastatin  40 mg- 90/3

## 2024-12-14 ENCOUNTER — Encounter: Payer: Self-pay | Admitting: Internal Medicine

## 2024-12-17 ENCOUNTER — Encounter (HOSPITAL_BASED_OUTPATIENT_CLINIC_OR_DEPARTMENT_OTHER): Payer: Self-pay

## 2024-12-17 ENCOUNTER — Ambulatory Visit (HOSPITAL_BASED_OUTPATIENT_CLINIC_OR_DEPARTMENT_OTHER): Admitting: Cardiology

## 2024-12-19 NOTE — Progress Notes (Signed)
 " Cardiology Office Note:  .   Date:  12/20/2024  ID:  Cory Moore, DOB 12/30/1960, MRN [id number] PCP: Katrinka Garnette KIDD, MD  [facility] Providers Cardiologist:  Shelda Bruckner, MD {  History of Present Illness: .   Cory Moore is a 64 y.o. male with PMH prior tobacco use, hyperlipidemia seen as a new patient consultation for CAD at the request of Dr. Katrinka.  Referral from 11/07/24 reviewed. Had Ca score 10/29/24 which showed total score 1779, 98th percentile.  Cardiovascular risk factors: Prior clinical ASCVD: none Comorbid conditions:  Denies hypertension, diabetes, chronic kidney disease, hepatic steatosis, sleep apnea. Has been on medication for cholesterol for about 30 years.  Metabolic syndrome/Obesity: Highest adult weight 260 lbs Chronic inflammatory conditions: none Tobacco use history: smoked off/on for about 20 years, quit around age 54 with only occasional use since Alcohol and substance use: Family history: both parents still living. Father is 64, mother is 26. Both have high blood pressure. Brother and sister both take cholesterol medication. No history of heart attack or stroke in immediate family Prior pertinent testing and/or incidental findings: calcium  score as above Exercise level: able to hike, notes some shortness of breath with inclines that improves as he continues to hike. Doesn't stop him. In the summer, kayaks. Tries to walk on the weekends. Recently joined the [employer] gym.  I reviewed his images with him today.   ROS: Denies chest pain, shortness of breath at rest or with normal exertion. No PND, orthopnea, LE edema or unexpected weight gain. No syncope or palpitations. ROS otherwise negative except as noted.   Studies Reviewed: SABRA    EKG:  EKG Interpretation Date/Time:  Thursday December 20 2024 11:04:30 EST Ventricular Rate:  62 PR Interval:  158 QRS Duration:  120 QT Interval:  450 QTC Calculation: 456 R Axis:   31  Text  Interpretation: Normal sinus rhythm Right bundle branch block Confirmed by Bruckner Shelda [phone]) on 12/20/2024 11:26:48 AM    Physical Exam:   VS:  BP 106/78 (BP Location: Left Arm, Patient Position: Sitting, Cuff Size: Large)   Pulse 62   Ht 6' 4 (1.93 m)   Wt 252 lb 8 oz (114.5 kg)   SpO2 96%   BMI 30.74 kg/m    Wt Readings from Last 3 Encounters:  12/20/24 252 lb 8 oz (114.5 kg)  10/19/24 246 lb 9.6 oz (111.9 kg)  10/18/23 250 lb 3.2 oz (113.5 kg)    GEN: Well nourished, well developed in no acute distress HEENT: Normal, moist mucous membranes NECK: No JVD CARDIAC: regular rhythm, normal S1 and S2, no rubs or gallops. No murmur. VASCULAR: Radial and DP pulses 2+ bilaterally. No carotid bruits RESPIRATORY:  Clear to auscultation without rales, wheezing or rhonchi  ABDOMEN: Soft, non-tender, non-distended MUSCULOSKELETAL:  Ambulates independently SKIN: Warm and dry, no edema NEUROLOGIC:  Alert and oriented x 3. No focal neuro deficits noted. PSYCHIATRIC:  Normal affect    ASSESSMENT AND PLAN: .    Coronary artery calcification, consistent with CAD Hypercholesterolemia -we reviewed his test results and images together today -has been on cholesterol medication for about 30 years -he changed from simvastatin  40 mg to rosuvastatin  40 mg after the test. LDL 106 10/2024 on simvastatin  -discussed when to recheck. Also discussed lipoprotein(a) today, will order. LDL goal <70 -has started taking aspirin  81 mg, tolerating -reviewed red flag warning signs that need immediate medical attention -he is looking to increase his activity level. Given  this and high CA score, will proceed with treadmill stress test. If test abnormal, we did discuss risk of false positive and need for follow up testing.  Informed Consent   Shared Decision Making/Informed Consent{ The risks [chest pain, shortness of breath, cardiac arrhythmias, dizziness, blood pressure fluctuations, myocardial  infarction, stroke/transient ischemic attack, and life-threatening complications (estimated to be 1 in 10,000)], benefits (risk stratification, diagnosing coronary artery disease, treatment guidance) and alternatives of an exercise tolerance test were discussed in detail with Cory Moore and he agrees to proceed.      CV risk counseling and prevention -recommend heart healthy/Mediterranean diet, with whole grains, fruits, vegetable, fish, lean meats, nuts, and olive oil. Limit salt. -recommend moderate walking, 3-5 times/week for 30-50 minutes each session. Aim for at least 150 minutes/week. Goal should be pace of 3 miles/hours, or walking 1.5 miles in 30 minutes -recommend avoidance of tobacco products. Avoid excess alcohol.  Dispo: if ETT unremarkable, follow up in 1 year  Signed, Shelda Bruckner, MD   Shelda Bruckner, MD, PhD, [facility]  [facility]  [place]  [facility] at [facility] [address], [address] [place], [place] [address] [phone]   "

## 2024-12-20 ENCOUNTER — Ambulatory Visit (INDEPENDENT_AMBULATORY_CARE_PROVIDER_SITE_OTHER): Admitting: Cardiology

## 2024-12-20 ENCOUNTER — Encounter (HOSPITAL_BASED_OUTPATIENT_CLINIC_OR_DEPARTMENT_OTHER): Payer: Self-pay | Admitting: Cardiology

## 2024-12-20 VITALS — BP 106/78 | HR 62 | Ht 76.0 in | Wt 252.5 lb

## 2024-12-20 DIAGNOSIS — E7800 Pure hypercholesterolemia, unspecified: Secondary | ICD-10-CM | POA: Diagnosis not present

## 2024-12-20 DIAGNOSIS — I2510 Atherosclerotic heart disease of native coronary artery without angina pectoris: Secondary | ICD-10-CM | POA: Diagnosis not present

## 2024-12-20 DIAGNOSIS — Z712 Person consulting for explanation of examination or test findings: Secondary | ICD-10-CM | POA: Diagnosis not present

## 2024-12-20 DIAGNOSIS — Z7189 Other specified counseling: Secondary | ICD-10-CM | POA: Diagnosis not present

## 2024-12-20 MED ORDER — ASPIRIN 81 MG PO TBEC: 81.0000 mg | DELAYED_RELEASE_TABLET | Freq: Every day | ORAL | Status: AC

## 2024-12-20 NOTE — Patient Instructions (Signed)
 Medication Instructions:  No changes today *If you need a refill on your cardiac medications before your next appointment, please call your pharmacy*  Lab Work: In about one month - return for fasting blood work (lipids, LPa)  Testing/Procedures: Your physician has requested that you have an Exercise Stress Test.  Please arrive 15 minutes prior to your appointment time for registration.  The test will take approximately 45 minutes to complete.  Instructions: You may take your medications the morning of the test (someone from the testing area will call you with these specific instructions). Light breakfast and or lunch. Dress prepared to exercise.  Please wear a 2-piece outfit (no dresses, overalls, etc.), and wear shoes appropriate for walking (must be closed toe and heel). Do not wear cologne, perfume, aftershave or lotions (deodorant is allowed). If you use an inhaler, bring it with you to the test.  Please note: If anyone comes with you to the appointment, they will need to remain in the main lobby due to limited space in the testing area. We also ask that you not bring children with you during testing. Due to room size and safety concerns, children are not allowed in the testing rooms during exams. Our front office staff cannot provide observation of children in our lobby area while testing is being conducted.   Follow-Up: At [facility], you and your health needs are our priority.  As part of our continuing mission to provide you with exceptional heart care, our providers are all part of one team.  This team includes your primary Cardiologist (physician) and Advanced Practice Providers or APPs (Physician Assistants and Nurse Practitioners) who all work together to provide you with the care you need, when you need it.  Your next appointment:   12 month(s)  Provider:   Shelda Bruckner, MD, Rosaline Bane, NP, or Reche Finder, NP

## 2024-12-28 ENCOUNTER — Encounter (HOSPITAL_COMMUNITY)

## 2025-01-25 ENCOUNTER — Encounter

## 2025-02-08 ENCOUNTER — Encounter: Admitting: Internal Medicine

## 2025-10-23 ENCOUNTER — Encounter: Admitting: Family Medicine
